# Patient Record
Sex: Female | Born: 1960 | Race: White | Hispanic: No | Marital: Married | Smoking: Never smoker
Health system: Southern US, Community
[De-identification: ages and names within clinical notes are randomized; demographics above are authoritative.]

## PROBLEM LIST (undated history)

## (undated) DIAGNOSIS — Z789 Other specified health status: Secondary | ICD-10-CM

## (undated) DIAGNOSIS — L719 Rosacea, unspecified: Secondary | ICD-10-CM

## (undated) HISTORY — DX: Rosacea, unspecified: L71.9

---

## 2009-07-14 ENCOUNTER — Encounter: Payer: Self-pay | Admitting: Gynecology

## 2009-07-14 ENCOUNTER — Ambulatory Visit: Payer: Self-pay | Admitting: Gynecology

## 2009-07-14 ENCOUNTER — Other Ambulatory Visit: Admission: RE | Admit: 2009-07-14 | Discharge: 2009-07-14 | Payer: Self-pay | Admitting: Gynecology

## 2009-10-03 ENCOUNTER — Ambulatory Visit: Payer: Self-pay | Admitting: Gynecology

## 2010-03-14 ENCOUNTER — Encounter: Admission: RE | Admit: 2010-03-14 | Discharge: 2010-03-14 | Payer: Self-pay | Admitting: Gynecology

## 2010-03-16 ENCOUNTER — Other Ambulatory Visit: Admission: RE | Admit: 2010-03-16 | Discharge: 2010-03-16 | Payer: Self-pay | Admitting: Gynecology

## 2010-03-16 ENCOUNTER — Ambulatory Visit: Payer: Self-pay | Admitting: Gynecology

## 2011-01-03 ENCOUNTER — Encounter: Payer: Self-pay | Admitting: Gynecology

## 2011-01-17 ENCOUNTER — Encounter (INDEPENDENT_AMBULATORY_CARE_PROVIDER_SITE_OTHER): Payer: 59 | Admitting: Gynecology

## 2011-01-17 ENCOUNTER — Other Ambulatory Visit (HOSPITAL_COMMUNITY)
Admission: RE | Admit: 2011-01-17 | Discharge: 2011-01-17 | Disposition: A | Discharge status: Home / Self Care | Payer: 59 | Source: Ambulatory Visit | Attending: Gynecology | Admitting: Gynecology

## 2011-01-17 ENCOUNTER — Other Ambulatory Visit: Payer: Self-pay | Admitting: Gynecology

## 2011-01-17 DIAGNOSIS — Z01419 Encounter for gynecological examination (general) (routine) without abnormal findings: Secondary | ICD-10-CM

## 2011-01-17 DIAGNOSIS — N926 Irregular menstruation, unspecified: Secondary | ICD-10-CM

## 2011-01-17 DIAGNOSIS — Z13220 Encounter for screening for lipoid disorders: Secondary | ICD-10-CM

## 2011-01-17 DIAGNOSIS — R823 Hemoglobinuria: Secondary | ICD-10-CM

## 2011-01-17 DIAGNOSIS — Z124 Encounter for screening for malignant neoplasm of cervix: Secondary | ICD-10-CM | POA: Insufficient documentation

## 2011-02-14 ENCOUNTER — Other Ambulatory Visit: Payer: 59

## 2011-02-14 ENCOUNTER — Ambulatory Visit (INDEPENDENT_AMBULATORY_CARE_PROVIDER_SITE_OTHER): Payer: 59 | Admitting: Gynecology

## 2011-02-14 DIAGNOSIS — D259 Leiomyoma of uterus, unspecified: Secondary | ICD-10-CM

## 2011-02-14 DIAGNOSIS — N930 Postcoital and contact bleeding: Secondary | ICD-10-CM

## 2011-02-14 DIAGNOSIS — N8300 Follicular cyst of ovary, unspecified side: Secondary | ICD-10-CM

## 2011-02-14 DIAGNOSIS — N831 Corpus luteum cyst of ovary, unspecified side: Secondary | ICD-10-CM

## 2011-03-08 ENCOUNTER — Other Ambulatory Visit: Payer: 59

## 2011-03-08 NOTE — H&P (Signed)
  [name], [name]               ACCOUNT NO.:  [id number]  MEDICAL RECORD NO.:  [id number]           PATIENT TYPE:  O  LOCATION:  InputValueNotLocate           FACILITY:  [facility]  PHYSICIAN:  Timothy P. Fontaine, M.D.DATE OF BIRTH:  [birth year]  DATE OF ADMISSION:  03/09/2011 DATE OF DISCHARGE:                             HISTORY & PHYSICAL   The patient being admitted to [facility] on Mar 09, 2011, at 7:30 a.m. for surgery.  CHIEF COMPLAINT:  Postcoital bleeding, irregular menses.  HISTORY OF PRESENT ILLNESS:  This 50 year old G28, P4 female presents with history of postcoital spotting and the irregular menses occurring every 2 to 3 weeks.  She had workup including a normal hormone studies and a sonohysterogram which showed a 21-mm posterior midline cavity defect consistent with an endometrial polyp.  The patient is admitted at this time for hysteroscopy D\and C, removal of endometrial polyp.  PAST MEDICAL HISTORY:  Uncomplicated.  PAST SURGICAL HISTORY:  Cesarean section.  CURRENT MEDICATIONS:  Multivitamin.  ALLERGIES:  PENICILLIN, SULFA, MACROBID  REVIEW OF SYSTEMS:  Noncontributory.  FAMILY HISTORY:  Noncontributory.  SOCIAL HISTORY:  Noncontributory.  ADMISSION PHYSICAL EXAMINATION:  VITAL SIGNS:  Afebrile.  Vital signs are stable. HEENT:  Normal. LUNGS:  Clear. CARDIAC:  Regular rate with no rubs, murmurs or gallops. ABDOMEN:  Abdominal exam benign. PELVIC:  External BUS, vagina normal.  Cervix normal.  Uterus retroverted, grossly normal in size, midline mobile, nontender.  Adnexa without masses or tenderness.  ASSESSMENT:  A 50 year old G58, P4 female, postcoital spotting, irregular menses, recent [facility] was 18 with sonohysterogram showing an endometrial polyp in the posterior cavity.  The patient is admitted for hysteroscopy D and C, removal of the endometrial polyp.  I discussed with the patient the proposed surgery, expected intraoperative and  postoperative courses, use of the hysteroscope and resectoscope in the the D and C portion.  I reviewed the risks of bleeding, transfusion, the risks of transfusion to include transfusion reaction, hepatitis, HIV, mad cow disease and other unknown entities, the risk of infection requiring prolonged antibiotics, uterine perforation causing damage to internal organs including bowel, bladder, ureters, vessels and nerves necessitating major exploratory reparative surgeries and future reparative surgeries including bowel resection, ureteral damage repair, bladder repair, ostomy formation were all discussed, understood and accepted.  The risk of distended media absorption leading to metabolic complications such as common seizures was also reviewed with her.  The patient's questions were answered to her satisfaction.  She is ready to proceed with surgery.     Timothy P. Audie Box, M.D.     TPF/MEDQ  D:  03/06/2011  T:  03/06/2011  Job:  [id number]  Electronically Signed by Colin Broach M.D. on 03/08/2011 05:35:08 PM

## 2011-03-09 ENCOUNTER — Ambulatory Visit (HOSPITAL_BASED_OUTPATIENT_CLINIC_OR_DEPARTMENT_OTHER)
Admission: RE | Admit: 2011-03-09 | Discharge: 2011-03-09 | Disposition: A | Discharge status: Home / Self Care | Payer: 59 | Source: Ambulatory Visit | Attending: Gynecology | Admitting: Gynecology

## 2011-03-09 ENCOUNTER — Other Ambulatory Visit: Payer: Self-pay | Admitting: Gynecology

## 2011-03-09 DIAGNOSIS — N840 Polyp of corpus uteri: Secondary | ICD-10-CM | POA: Insufficient documentation

## 2011-03-09 DIAGNOSIS — N926 Irregular menstruation, unspecified: Secondary | ICD-10-CM

## 2011-03-09 DIAGNOSIS — N930 Postcoital and contact bleeding: Secondary | ICD-10-CM | POA: Insufficient documentation

## 2011-03-09 DIAGNOSIS — N841 Polyp of cervix uteri: Secondary | ICD-10-CM | POA: Insufficient documentation

## 2011-03-09 LAB — POCT HEMOGLOBIN-HEMACUE: Hemoglobin: 13.5 g/dL (ref 12.0–15.0)

## 2011-03-09 LAB — POCT PREGNANCY, URINE: Preg Test, Ur: NEGATIVE

## 2011-03-22 ENCOUNTER — Ambulatory Visit (INDEPENDENT_AMBULATORY_CARE_PROVIDER_SITE_OTHER): Payer: 59 | Admitting: Gynecology

## 2011-03-22 DIAGNOSIS — Z9889 Other specified postprocedural states: Secondary | ICD-10-CM

## 2011-03-26 NOTE — Op Note (Signed)
  NAMESHAKYRA, MATTERA NO.:  [id number]  MEDICAL RECORD NO.:  [id number]           PATIENT TYPE:  LOCATION:                                 FACILITY:  PHYSICIAN:  Noach Calvillo P. Jailey Booton, M.D.DATE OF BIRTH:  [birth year]  DATE OF PROCEDURE:  03/09/2011 DATE OF DISCHARGE:                              OPERATIVE REPORT   PREOPERATIVE DIAGNOSES:  Postcoital bleeding, irregular menses, endometrial polyp.  POSTOPERATIVE DIAGNOSES:  Postcoital bleeding, irregular menses, endometrial polyp with additional endocervical polyp.  PROCEDURE:  Hysteroscopy, polypectomy, D and C.  SURGEON:  Keren Alverio P. Nathen Balaban, M.D.  ANESTHETIC:  General with 1% lidocaine paracervical block.  ESTIMATED BLOOD LOSS:  Minimal.  DISTENDED MEDIA DISCREPANCY:  50 mL.  COMPLICATIONS:  None.  SPECIMENS: 1. Polyps x2. 2. Endometrial curetting.  FINDINGS:  EUA, external BUS and vagina normal.  Cervix normal.  Uterus retroverted, normal size, midline, mobile.  Adnexa without masses. Hysteroscopic with finger-like upper endocervical polyp removed in its entirety, broad-based endometrial polyp, posterior uterine surface, removed in its entirety.  Hysteroscopy was otherwise adequate normal, noting fundus, anterior-posterior uterine surfaces, lower uterine segment, endocervical canal, right and left tubal ostia all visualized.  PROCEDURE:  The patient was taken to the operating room, underwent general anesthesia, placed in low dorsal lithotomy position, received a perineal and vaginal preparation with Betadine solution.  Bladder emptied with in-and-out Foley catheterization.  EUA performed and the patient was draped in usual fashion.  Cervix was visualized with a speculum.  Anterior lip grasped with a single-tooth tenaculum and a paracervical block using 1% lidocaine, total of 8 mL was placed.  Cervix was gently gradually dilated to admit the operative hysteroscope. Hysteroscopy was performed with  findings noted above.  The endocervical polyp was excised at its base using the right angle resectoscopic loop at the level of the surrounding endocervix and was sent to pathology intact.  The broad-based endometrial polyp was then also excised intact through several passes, transecting the broad base and again was sent to pathology.  A sharp curettage was then performed.  Re-hysteroscopy showed an empty cavity, good distention, no evidence of perforation. Both polyp specimens were sent as well as the endometrial curetting to pathology.  The instruments were removed.  Hemostasis visualized at the tenaculum site and the external os.  Speculum removed.  The patient placed in supine position, received intraoperative Toradol, was awakened without difficulty and taken to recovery room in good condition having tolerated the procedure well.     Merian Wroe P. Audie Box, M.D.     TPF/MEDQ  D:  03/09/2011  T:  03/09/2011  Job:  [id number]  Electronically Signed by Colin Broach M.D. on 03/26/2011 09:23:19 AM

## 2011-10-12 ENCOUNTER — Telehealth: Payer: Self-pay | Admitting: *Deleted

## 2011-10-12 NOTE — Telephone Encounter (Signed)
Recommend office visit to evaluate. If the bleeding becomes heavy of the weekend she can call me as I am on call and I prescribe something to slow it down or stop it. Hopefully it will stop on its own. She does still need to see me regardless so have her make an appointment for next week.

## 2011-10-12 NOTE — Telephone Encounter (Signed)
Pt called c/o bleeding, she had polyps removed in may. Pt said she had no bleeding all summer then in Nov 24 she begin to bleed for about 8-9 days and stopped and bleeding began again and has not stopped. Mild flow of bleeding that is bright red, pt is concerned if this is normal? Please advise

## 2011-10-12 NOTE — Telephone Encounter (Signed)
Pt informed with the below note, and will make appointment.

## 2011-10-18 ENCOUNTER — Encounter: Payer: Self-pay | Admitting: Gynecology

## 2011-10-18 ENCOUNTER — Encounter: Payer: Self-pay | Admitting: Anesthesiology

## 2011-10-18 ENCOUNTER — Ambulatory Visit (INDEPENDENT_AMBULATORY_CARE_PROVIDER_SITE_OTHER): Payer: 59 | Admitting: Gynecology

## 2011-10-18 VITALS — BP 110/68

## 2011-10-18 DIAGNOSIS — N926 Irregular menstruation, unspecified: Secondary | ICD-10-CM

## 2011-10-18 MED ORDER — MEDROXYPROGESTERONE ACETATE 10 MG PO TABS: 10.0000 mg | ORAL_TABLET | Freq: Every day | ORAL | Status: DC

## 2011-10-18 NOTE — Patient Instructions (Signed)
Take Provera as discussed. If without menses for 6 weeks call and we will plan intermittent progesterone withdrawals.. If sooner irregular bleeding call and we'll pursue a further evaluation.

## 2011-10-18 NOTE — Progress Notes (Signed)
Patient presents having had a hysteroscopy D&C for irregular menses and endometrial polyps in May 2012. She had an FSH at that time of 18. She had no menses following until November and then 2 weeks following having of her menses where she bled heavily and now she is tapering off. No pain, hot flushes, night sweats or other symptoms.   Exam with chaperone present Pelvic external BUS vagina normal, cervix normal, uterus retroverted normal size midline mobile nontender, adnexa without masses or tenderness  Assessment and plan: Irregular menses. Recent hysteroscopy D&C 6 months ago showed benign endometrial polyps and proliferative endometrium. Recommended Provera 10 mg x10 days withdrawal now. Keep menstrual calendar if without menses at 6 week intervals she will call me and we'll plan on intermittent progesterone withdrawal. If sooner irregular bleeding continues then she will call me and we'll proceed with a further evaluation to start with sonohysterogram. I would recheck her [facility] today and she'll follow her for these results.

## 2012-07-29 ENCOUNTER — Encounter: Payer: 59 | Admitting: Gynecology

## 2012-08-07 ENCOUNTER — Ambulatory Visit (INDEPENDENT_AMBULATORY_CARE_PROVIDER_SITE_OTHER): Payer: 59 | Admitting: Gynecology

## 2012-08-07 ENCOUNTER — Encounter: Payer: Self-pay | Admitting: Gynecology

## 2012-08-07 VITALS — BP 118/76 | Ht 63.5 in | Wt 142.0 lb

## 2012-08-07 DIAGNOSIS — Z01419 Encounter for gynecological examination (general) (routine) without abnormal findings: Secondary | ICD-10-CM

## 2012-08-07 DIAGNOSIS — N926 Irregular menstruation, unspecified: Secondary | ICD-10-CM

## 2012-08-07 DIAGNOSIS — Z13220 Encounter for screening for lipoid disorders: Secondary | ICD-10-CM

## 2012-08-07 LAB — CBC WITH DIFFERENTIAL/PLATELET
Basophils Absolute: 0.1 10*3/uL (ref 0.0–0.1)
Basophils Relative: 2 % — ABNORMAL HIGH (ref 0–1)
Eosinophils Absolute: 0.4 10*3/uL (ref 0.0–0.7)
Eosinophils Relative: 6 % — ABNORMAL HIGH (ref 0–5)
Hemoglobin: 13.2 g/dL (ref 12.0–15.0)
Lymphs Abs: 1.8 10*3/uL (ref 0.7–4.0)
MCHC: 33.8 g/dL (ref 30.0–36.0)
MCV: 85.7 fL (ref 78.0–100.0)
RDW: 14.5 % (ref 11.5–15.5)

## 2012-08-07 NOTE — Progress Notes (Signed)
[name] 12-May-1961 [id number]        52 y.o.  G4P4 for annual exam.  Several issues noted below.  Past medical history,surgical history, medications, allergies, family history and social history were all reviewed and documented in the EPIC chart. ROS:  Was performed and pertinent positives and negatives are included in the history.  Exam: Fleet Contras assistant Filed Vitals:   08/07/12 1619  BP: 118/76  Height: 5' 3.5" (1.613 m)  Weight: 142 lb (64.411 kg)   General appearance  Normal Skin grossly normal Head/Neck normal with no cervical or supraclavicular adenopathy thyroid normal Lungs  clear Cardiac RR, without RMG Abdominal  soft, nontender, without masses, organomegaly or hernia Breasts  examined lying and sitting without masses, retractions, discharge or axillary adenopathy. Pelvic  Ext/BUS/vagina  normal   Cervix  normal   Uterus  retroverted, normal size, shape and contour, midline and mobile nontender   Adnexa  Without masses or tenderness    Anus and perineum  normal   Rectovaginal  normal sphincter tone without palpated masses or tenderness.    Assessment/Plan:  51 y.o. G25P4 female for annual exam, vasectomy birth control.   1. Mild menstrual regularity. Patient underwent hysteroscopy D&C for endometrial polyp May 2012. Menses have been basically monthly although she has skipped an occasional month with some mild irregularity. Will check FSH. At this point recommended keeping menstrual calendar. Reviewed signs and symptoms of menopause and she'll alert me if she has significant hot flashes/night sweats or significant menstrual irregularity. As long as less frequent but regular menses them will monitor. 2. Mammography. Patients do now knows to schedule. SBE monthly reviewed. 3. Pap smear. No Pap smear done today. Last Pap smear 2012. No history of significant abnormalities. We'll plan every 3-5 year screening. 4. Colonoscopy. Recommended screening colonoscopy. Names given  for her to call. 5. Health maintenance. Baseline CBC comprehensive metabolic panel lipid profile urinalysis along with her [facility] ordered. Follow up one year, sooner as needed.    Dara Lords MD, 5:12 PM 08/07/2012

## 2012-08-07 NOTE — Patient Instructions (Addendum)
Office will contact you with lab results. Keep menstrual calendar. As long as not significant irregularity them we'll monitor. If significant irregular bleeding or significant menopausal symptoms recommend follow up. Follow up in one year for annual gynecologic exam.

## 2012-08-08 ENCOUNTER — Encounter: Payer: Self-pay | Admitting: Gynecology

## 2012-08-08 LAB — URINALYSIS W MICROSCOPIC + REFLEX CULTURE
Bacteria, UA: NONE SEEN
Casts: NONE SEEN
Hgb urine dipstick: NEGATIVE
Ketones, ur: NEGATIVE mg/dL
Protein, ur: NEGATIVE mg/dL
Specific Gravity, Urine: 1.024 (ref 1.005–1.030)
Squamous Epithelial / LPF: NONE SEEN

## 2012-08-08 LAB — COMPREHENSIVE METABOLIC PANEL
AST: 23 U/L (ref 0–37)
BUN: 13 mg/dL (ref 6–23)
CO2: 28 mEq/L (ref 19–32)
Creat: 0.84 mg/dL (ref 0.50–1.10)
Glucose, Bld: 83 mg/dL (ref 70–99)
Potassium: 4.8 mEq/L (ref 3.5–5.3)
Total Protein: 6.6 g/dL (ref 6.0–8.3)

## 2012-08-08 LAB — LIPID PANEL
Cholesterol: 169 mg/dL (ref 0–200)
HDL: 55 mg/dL (ref >39)
LDL Cholesterol: 102 mg/dL — ABNORMAL HIGH (ref 0–99)
Triglycerides: 61 mg/dL (ref <150)
VLDL: 12 mg/dL (ref 0–40)

## 2012-08-08 LAB — FOLLICLE STIMULATING HORMONE: FSH: 61.3 m[IU]/mL

## 2012-09-09 ENCOUNTER — Encounter: Payer: Self-pay | Admitting: *Deleted

## 2012-09-09 NOTE — Progress Notes (Signed)
Patient ID: [name], female   DOB: September 30, 1961, 51 y.o.   MRN: [id number] Pt left message for recent lab result, left message for pt to call.

## 2012-09-10 ENCOUNTER — Encounter: Payer: Self-pay | Admitting: *Deleted

## 2012-09-10 NOTE — Progress Notes (Signed)
Patient ID: [name], female   DOB: November 11, 1960, 51 y.o.   MRN: [id number] Pt called requesting recent lab results, left message for pt to call.

## 2012-09-12 ENCOUNTER — Telehealth: Payer: Self-pay | Admitting: *Deleted

## 2012-09-12 NOTE — Telephone Encounter (Signed)
Pt called requesting lab results 08/07/12 OV left results on voicemail. Expect FSH results, pt was already informed.

## 2013-12-21 ENCOUNTER — Other Ambulatory Visit (HOSPITAL_COMMUNITY)
Admission: RE | Admit: 2013-12-21 | Discharge: 2013-12-21 | Disposition: A | Discharge status: Home / Self Care | Payer: BC Managed Care – PPO | Source: Ambulatory Visit | Attending: Gynecology | Admitting: Gynecology

## 2013-12-21 ENCOUNTER — Encounter: Payer: Self-pay | Admitting: Gynecology

## 2013-12-21 ENCOUNTER — Ambulatory Visit (INDEPENDENT_AMBULATORY_CARE_PROVIDER_SITE_OTHER): Payer: BC Managed Care – PPO | Admitting: Gynecology

## 2013-12-21 VITALS — BP 120/72 | Ht 63.0 in | Wt 143.0 lb

## 2013-12-21 DIAGNOSIS — Z1151 Encounter for screening for human papillomavirus (HPV): Secondary | ICD-10-CM | POA: Insufficient documentation

## 2013-12-21 DIAGNOSIS — N9489 Other specified conditions associated with female genital organs and menstrual cycle: Secondary | ICD-10-CM

## 2013-12-21 DIAGNOSIS — N951 Menopausal and female climacteric states: Secondary | ICD-10-CM

## 2013-12-21 DIAGNOSIS — N898 Other specified noninflammatory disorders of vagina: Secondary | ICD-10-CM

## 2013-12-21 DIAGNOSIS — Z01419 Encounter for gynecological examination (general) (routine) without abnormal findings: Secondary | ICD-10-CM

## 2013-12-21 LAB — CBC WITH DIFFERENTIAL/PLATELET
Basophils Absolute: 0.1 10*3/uL (ref 0.0–0.1)
Basophils Relative: 2 % — ABNORMAL HIGH (ref 0–1)
Eosinophils Absolute: 0.3 10*3/uL (ref 0.0–0.7)
Eosinophils Relative: 4 % (ref 0–5)
HEMATOCRIT: 39.9 % (ref 36.0–46.0)
HEMOGLOBIN: 13.4 g/dL (ref 12.0–15.0)
Lymphocytes Relative: 28 % (ref 12–46)
Lymphs Abs: 1.8 10*3/uL (ref 0.7–4.0)
MCH: 28.8 pg (ref 26.0–34.0)
MCHC: 33.6 g/dL (ref 30.0–36.0)
MCV: 85.6 fL (ref 78.0–100.0)
Monocytes Absolute: 0.5 10*3/uL (ref 0.1–1.0)
Monocytes Relative: 8 % (ref 3–12)
NEUTROS PCT: 58 % (ref 43–77)
Neutro Abs: 3.7 10*3/uL (ref 1.7–7.7)
PLATELETS: 439 10*3/uL — AB (ref 150–400)
RBC: 4.66 MIL/uL (ref 3.87–5.11)
RDW: 14.1 % (ref 11.5–15.5)
WBC: 6.4 10*3/uL (ref 4.0–10.5)

## 2013-12-21 LAB — LIPID PANEL
CHOL/HDL RATIO: 3.2 ratio
Cholesterol: 172 mg/dL (ref 0–200)
HDL: 54 mg/dL (ref >39)
LDL CALC: 104 mg/dL — AB (ref 0–99)
TRIGLYCERIDES: 68 mg/dL (ref <150)
VLDL: 14 mg/dL (ref 0–40)

## 2013-12-21 LAB — COMPREHENSIVE METABOLIC PANEL
ALT: 14 U/L (ref 0–35)
AST: 20 U/L (ref 0–37)
Albumin: 4.4 g/dL (ref 3.5–5.2)
Alkaline Phosphatase: 56 U/L (ref 39–117)
BUN: 12 mg/dL (ref 6–23)
CALCIUM: 9.4 mg/dL (ref 8.4–10.5)
CHLORIDE: 105 meq/L (ref 96–112)
CO2: 30 meq/L (ref 19–32)
CREATININE: 0.8 mg/dL (ref 0.50–1.10)
Glucose, Bld: 107 mg/dL — ABNORMAL HIGH (ref 70–99)
POTASSIUM: 3.9 meq/L (ref 3.5–5.3)
Sodium: 143 mEq/L (ref 135–145)
TOTAL PROTEIN: 6.7 g/dL (ref 6.0–8.3)
Total Bilirubin: 0.5 mg/dL (ref 0.2–1.2)

## 2013-12-21 LAB — TSH: TSH: 1.955 u[IU]/mL (ref 0.350–4.500)

## 2013-12-21 NOTE — Patient Instructions (Signed)
Schedule colonoscopy with [facility] at [phone] or [facility] gastroenterology at [phone] Try over-the-counter soy-based products for the menopausal symptoms. Followup if your symptoms worsen and you want to discuss hormone replacement therapy. Call me if you do any vaginal bleeding. Followup in one year for annual exam.

## 2013-12-21 NOTE — Progress Notes (Signed)
[name] 08-07-1961 [id number]        52 y.o.  G4P4 for annual exam.  Several issues that below.  Past medical history,surgical history, problem list, medications, allergies, family history and social history were all reviewed and documented in the EPIC chart.  ROS:  Performed and pertinent positives and negatives are included in the history, assessment and plan .  Exam: [name] assistant Filed Vitals:   12/21/13 1458  BP: 120/72  Height: 5\' 3"  (1.6 m)  Weight: 143 lb (64.864 kg)   General appearance  Normal Skin grossly normal Head/Neck normal with no cervical or supraclavicular adenopathy thyroid normal Lungs  clear Cardiac RR, without RMG Abdominal  soft, nontender, without masses, organomegaly or hernia Breasts  examined lying and sitting without masses, retractions, discharge or axillary adenopathy. Pelvic  Ext/BUS/vagina normal  Cervix normal. Pap/HPV  Uterus anteverted, normal size, shape and contour, midline and mobile nontender   Adnexa  Without masses or tenderness    Anus and perineum  Normal   Rectovaginal  Normal sphincter tone without palpated masses or tenderness.    Assessment/Plan:  53 y.o. G107P4 female for annual exam, amenorrhea, vasectomy birth control.   1. Postmenopausal. Patient has stopped having menses over the past year. Last bleeding episode over 6 months ago as a light menses. Is having some hot flushes and night sweats as well as foggy thinking. Also having some vaginal dryness with intercourse. Reviewed options with her to include OTC products such as lubricants, soy-based products, prescription nonhormonal such as Effexor and full HRT.  I reviewed the whole issue of HRT with her to include the WHI study with increased risk of stroke, heart attack, DVT and breast cancer. The ACOG and NAMS statements for lowest dose for the shortest period of time reviewed. Transdermal versus oral first-pass effect benefit discussed. Patient not interested in  prescriptions at this time but prefers OTC product trial. Will followup with me if symptoms worsen and she wants to rediscuss medications. Patient does report any vaginal bleeding. 2. Pap smear 2012. Pap/HPV today. No history of significant abnormal Pap smears previously. Plan repeat Pap smear at 3-5 year interval assuming normal. 3. Mammography 08/2013. Continue with annual mammography. SBE monthly reviewed. 4. Colonoscopy never. I again strongly recommended that she schedule a baseline colonoscopy. Name is provided. Benefits of early detection reviewed. 5. DEXA never. We'll plan further into the menopause. Increased calcium vitamin D recommendations reviewed. 6. Health maintenance. Baseline CBC comprehensive metabolic panel lipid profile urinalysis vitamin D and TSH ordered. Followup in one year, sooner as noted above.   Note: This document was prepared with digital dictation and possible smart phrase technology. Any transcriptional errors that result from this process are unintentional.   Anastasio Auerbach MD, 3:41 PM 12/21/2013

## 2013-12-22 LAB — VITAMIN D 25 HYDROXY (VIT D DEFICIENCY, FRACTURES): VIT D 25 HYDROXY: 39 ng/mL (ref 30–89)

## 2013-12-22 LAB — URINALYSIS W MICROSCOPIC + REFLEX CULTURE
BILIRUBIN URINE: NEGATIVE
Bacteria, UA: NONE SEEN
Casts: NONE SEEN
Crystals: NONE SEEN
GLUCOSE, UA: NEGATIVE mg/dL
KETONES UR: NEGATIVE mg/dL
Leukocytes, UA: NEGATIVE
Nitrite: NEGATIVE
PROTEIN: NEGATIVE mg/dL
SQUAMOUS EPITHELIAL / LPF: NONE SEEN
Specific Gravity, Urine: <1.005 — ABNORMAL LOW (ref 1.005–1.030)
UROBILINOGEN UA: 0.2 mg/dL (ref 0.0–1.0)
pH: 5 (ref 5.0–8.0)

## 2013-12-25 ENCOUNTER — Encounter: Payer: Self-pay | Admitting: Gynecology

## 2014-03-10 ENCOUNTER — Telehealth: Payer: Self-pay | Admitting: *Deleted

## 2014-03-10 NOTE — Telephone Encounter (Signed)
(  pt aware you are out of the office) Pt was told from [place] 12/21/13 to call if any bleeding, pt said she has been spotting since Sunday. Very light wearing panty liner only. Please advise

## 2014-03-11 NOTE — Telephone Encounter (Signed)
Recommend scheduling sonohystogram

## 2014-03-12 ENCOUNTER — Other Ambulatory Visit: Payer: Self-pay | Admitting: Gynecology

## 2014-03-12 DIAGNOSIS — N950 Postmenopausal bleeding: Secondary | ICD-10-CM

## 2014-03-12 NOTE — Telephone Encounter (Signed)
Patient informed. Transferred to the appt desk to schedule.

## 2014-03-12 NOTE — Telephone Encounter (Signed)
Left message for patient to call.

## 2014-04-05 ENCOUNTER — Other Ambulatory Visit: Payer: Self-pay | Admitting: Gynecology

## 2014-04-05 DIAGNOSIS — N950 Postmenopausal bleeding: Secondary | ICD-10-CM

## 2014-04-14 ENCOUNTER — Ambulatory Visit (INDEPENDENT_AMBULATORY_CARE_PROVIDER_SITE_OTHER): Payer: BC Managed Care – PPO | Admitting: Gynecology

## 2014-04-14 ENCOUNTER — Encounter: Payer: Self-pay | Admitting: Gynecology

## 2014-04-14 ENCOUNTER — Other Ambulatory Visit: Payer: Self-pay | Admitting: Gynecology

## 2014-04-14 ENCOUNTER — Ambulatory Visit (INDEPENDENT_AMBULATORY_CARE_PROVIDER_SITE_OTHER): Payer: BC Managed Care – PPO

## 2014-04-14 DIAGNOSIS — N839 Noninflammatory disorder of ovary, fallopian tube and broad ligament, unspecified: Secondary | ICD-10-CM

## 2014-04-14 DIAGNOSIS — N83209 Unspecified ovarian cyst, unspecified side: Secondary | ICD-10-CM

## 2014-04-14 DIAGNOSIS — N840 Polyp of corpus uteri: Secondary | ICD-10-CM

## 2014-04-14 DIAGNOSIS — N950 Postmenopausal bleeding: Secondary | ICD-10-CM

## 2014-04-14 DIAGNOSIS — N854 Malposition of uterus: Secondary | ICD-10-CM

## 2014-04-14 DIAGNOSIS — D251 Intramural leiomyoma of uterus: Secondary | ICD-10-CM

## 2014-04-14 DIAGNOSIS — D259 Leiomyoma of uterus, unspecified: Secondary | ICD-10-CM

## 2014-04-14 NOTE — Progress Notes (Addendum)
[name] 1961/03/02 [id number]        52 y.o.  G4P4 presents having called with a history of spotting last month lasting almost a week requiring a pain on her period and then one year without menses. Has had some episodes previously of sporadic spotting. Does have a history of endometrial polyps in the past status post hysteroscopic resection 2012.  Past medical history,surgical history, problem list, medications, allergies, family history and social history were all reviewed and documented in the EPIC chart.  Directed ROS with pertinent positives and negatives documented in the history of present illness/assessment and plan.  Exam: Pam assistant General appearance  Normal Pelvic external BUS vagina with atrophic changes. Cervix normal with atrophic changes. Uterus retroverted grossly normal in size midline mobile nontender. Adnexa without masses or tenderness  Ultrasound shows uterus normal size. Several small leiomyomata noted. Endometrial echo 3.1 mm. Right ovary normal with several small follicles. Left ovary with solid mass area measuring 13 x 15 x 16 mm. Cul-de-sac negative.   Sonohysterogram performed, sterile technique, easy catheter introduction, good distention with a small endometrial polyp 8 x 9 x 6 mm. Endometrial sample taken. Patient tolerated well.  Assessment/Plan:  [name] with perimenopausal bleeding. Ultrasound shows several small myomas stable from prior ultrasound. Endometrial echo thin but clear endometrial polyp visualized on sonohysterogram. Also with solid appearing area on left ovary not visualized with prior ultrasound 2012 questionable small fibroma versus physiologic change. Patient will follow up for biopsy result. I reviewed recommendation proceed with hysteroscopic resection of endometrial polyp and she agrees although does not want to do during the summer. I think reasonable to wait till the end of the summer early fall assuming initial biopsy negative and  she accepts I cannot guarantee pathology until then. Recommend followup ultrasound to relook at the left ovary. Plan to repeat the ultrasound in 2-3 months and then schedule hysteroscopy resection endometrial polyp following this the superior this area remains stable and or resolves. If this area enlarges then we'll discuss a more aggressive approach to include laparoscopy removal of the ovary and hysteroscopy D&C at the same time 2 including possible hysterectomy BSO. Again she understands I cannot guarantee pathology of the left ovary is comfortable with repeat ultrasound in 2 to 3 months.   Note: This document was prepared with digital dictation and possible smart phrase technology. Any transcriptional errors that result from this process are unintentional.   Anastasio Auerbach MD, 5:13 PM 04/14/2014

## 2014-04-14 NOTE — Patient Instructions (Addendum)
Office will call you with biopsy results. Followup for ultrasound as scheduled in 2-3 months. Report if bleeding becomes unacceptable in the interim.

## 2014-04-16 ENCOUNTER — Telehealth: Payer: Self-pay | Admitting: Gynecology

## 2014-04-16 ENCOUNTER — Telehealth: Payer: Self-pay

## 2014-04-16 NOTE — Telephone Encounter (Deleted)
Did you still want to plan a recheck of her Vitamin D level in 12 weeks? (I didn't know if this lower dose may take longer to see affect.)

## 2014-04-16 NOTE — Telephone Encounter (Signed)
Patient would really like to talk with you.  She said you had told her she had a "dense spot on part of her ovary".  She said she has been thinking about that and has concern that maybe she should do something sooner than 2-3 mos in regards to that.

## 2014-04-16 NOTE — Telephone Encounter (Signed)
We called the patient with the biopsy results from her sonohysterogram. She had more questions and I called her back. She was somewhat confused about the left ovary solid appearing mass measuring 13 x 15 x 16 mm. I again reviewed the situation that she has a small solid-appearing area in the left ovary. This may be a physiologic change but also may be a small tumor such as a fibroma. Options for management are to remove it now laparoscopically versus observation with repeat ultrasound in 2 months. Disclaimers far as cannot rule out cancer was reviewed. She is a small polyp in her uterus and we were planning on hysteroscopy but she wanted to postpone this to the end of summer. I think it is reasonable given the negative endometrial biopsy and her past history of benign endometrial polyps. The plan was to repeat the ultrasound on the ovary in 2 months and if this area is resolved and proceed with hysteroscopy. If there is any question about the ovary then to combine the procedure and remove the ovary along with hysteroscopy. Patient now clearly understands the situation. I encouraged her if she has any further questions to call back or she changes her mind and is uncomfortable with observation that we can proceed with laparoscopy now.

## 2014-04-16 NOTE — Telephone Encounter (Signed)
Message copied by Ramond Craver on Fri Apr 16, 2014  2:22 PM ------      Message from: Anastasio Auerbach      Created: Fri Apr 16, 2014  2:11 PM       Tell patient that the biopsy showed benign endometrial tissue. The plan we discussed was to repeat the ultrasound in 2-3 months and then plan hysteroscopy resection after that. If she has any questions I will be glad to talk to her. ------

## 2014-06-16 ENCOUNTER — Ambulatory Visit (INDEPENDENT_AMBULATORY_CARE_PROVIDER_SITE_OTHER): Payer: BC Managed Care – PPO

## 2014-06-16 ENCOUNTER — Encounter: Payer: Self-pay | Admitting: Gynecology

## 2014-06-16 ENCOUNTER — Ambulatory Visit (INDEPENDENT_AMBULATORY_CARE_PROVIDER_SITE_OTHER): Payer: BC Managed Care – PPO | Admitting: Gynecology

## 2014-06-16 DIAGNOSIS — N83209 Unspecified ovarian cyst, unspecified side: Secondary | ICD-10-CM

## 2014-06-16 DIAGNOSIS — N950 Postmenopausal bleeding: Secondary | ICD-10-CM

## 2014-06-16 DIAGNOSIS — N840 Polyp of corpus uteri: Secondary | ICD-10-CM

## 2014-06-16 NOTE — Patient Instructions (Signed)
Office will call you to arrange surgery. 

## 2014-06-16 NOTE — Progress Notes (Signed)
[name] June 17, 1961 [id number]        53 y.o.  G4P4 presents for followup ultrasound.  History of spotting for approximately a week in June after one year of no menses. Does have a history of endometrial polyps in the past with resection in 2012. Sonohysterogram was performed which showed a clear small 8 x 9 x 6 mm polyp but also showed a solid area on her left ovary questionable mass versus physiologic. Endometrial biopsy was benign. Patient was asked to followup for repeat ultrasound to make sure this area remains stable or resolved before scheduling her hysteroscopy in the event that additional surgery such as laparoscopy was required.  Past medical history,surgical history, problem list, medications, allergies, family history and social history were all reviewed and documented in the EPIC chart.  Directed ROS with pertinent positives and negatives documented in the history of present illness/assessment and plan.  Ultrasound shows uterus retroverted unchanged from her prior ultrasound. Right and left ovaries visualized and normal with no evidence of the prior solid area. Cul-de-sac is negative.  Assessment/Plan:  [name] with history of post menopausal bleeding and sonohysterogram showing endometrial polyp. Plan is to proceed with hysteroscopy D&C resection of the polyp.  Patient will schedule and then will represent for a preoperative consult before hand. She also asked for a physical therapy referral due to muscle spasm and a "catch" in her left trapezius muscle. Exam is normal at evidence of muscle spasm or mass. Will refer to physical therapy to see if they can help her with this.   Note: This document was prepared with digital dictation and possible smart phrase technology. Any transcriptional errors that result from this process are unintentional.   Anastasio Auerbach MD, 4:34 PM 06/16/2014

## 2014-06-17 ENCOUNTER — Telehealth: Payer: Self-pay | Admitting: *Deleted

## 2014-06-17 ENCOUNTER — Other Ambulatory Visit: Payer: Self-pay | Admitting: Gynecology

## 2014-06-17 MED ORDER — MISOPROSTOL 200 MCG PO TABS: ORAL_TABLET | ORAL | Status: DC

## 2014-06-17 NOTE — Telephone Encounter (Signed)
Message copied by Thamas Jaegers on Thu Jun 17, 2014 10:00 AM ------      Message from: [name]      Created: Wed Jun 16, 2014  4:44 PM       Patient requests a referral to physical therapy reference muscle spasm with catch left trapezius. ------

## 2014-06-17 NOTE — Telephone Encounter (Signed)
Appointment on 07/06/14 @ 2:30 pm at [facility]. Specialist, notes faxed to [phone]. Pt informed with all the above.

## 2014-07-01 ENCOUNTER — Telehealth: Payer: Self-pay | Admitting: *Deleted

## 2014-07-01 NOTE — Telephone Encounter (Signed)
Pt had her PT appointment with  Hilaria Ota. Specialist, scheduled for a third one today and is not happy with the progression. Was seen for reference muscle spasm with catch left trapezius. Please advise

## 2014-07-01 NOTE — Telephone Encounter (Signed)
Needs to see orthopedic surgeon for further evaluation and treatment

## 2014-07-06 NOTE — Telephone Encounter (Signed)
Pt informed with the below note, pt said she spoke to the PT and has a new therapist she is seeing at the time. And doing well

## 2014-07-15 ENCOUNTER — Encounter (HOSPITAL_COMMUNITY): Payer: Self-pay | Admitting: Pharmacist

## 2014-07-19 ENCOUNTER — Encounter (HOSPITAL_COMMUNITY): Payer: Self-pay | Admitting: *Deleted

## 2014-07-21 ENCOUNTER — Ambulatory Visit (INDEPENDENT_AMBULATORY_CARE_PROVIDER_SITE_OTHER): Payer: BC Managed Care – PPO | Admitting: Gynecology

## 2014-07-21 ENCOUNTER — Encounter: Payer: Self-pay | Admitting: Gynecology

## 2014-07-21 DIAGNOSIS — N950 Postmenopausal bleeding: Secondary | ICD-10-CM

## 2014-07-21 DIAGNOSIS — N840 Polyp of corpus uteri: Secondary | ICD-10-CM

## 2014-07-21 NOTE — H&P (Signed)
[name] Jul 23, 1961 [id number]   History and Physical  Chief complaint: postmenopausal bleeding, endometrial polyp  History of present illness: [name] with history of spotting in May. Follow up sonohysterogram showed several small leiomyoma with endometrial echo 3.1 mm. Small 8 x 9 x 6 mm endometrial polyp noted.  Endometrial biopsy showed: Endometrium, biopsy, uterus - MINUTE FRAGMENTS OF DISAGGREGATED ENDOMETRIAL GLANDS AND STROMA; NEGATIVE FOR HYPERPLASIA OR MALIGNANCY. - MINUTE FRAGMENTS OF BENIGN ENDOCERVICAL MUCOSA. - MINUTE FRAGMENTS OF SQUAMOUS EPITHELIUM; NEGATIVE FOR INTRAEPITHELIAL LESION OR MALIGNANCY. She did have an solid appearing area on her left ovary and before proceeding with hysteroscopy D&C we repeated the ultrasound at a 2 month interval in August and this area resolved. Patient is admitted at this time for hysteroscopy D&C resection of the endometrial polyp.  Past medical history,surgical history, medications, allergies, family history and social history were all reviewed and documented in the EPIC chart.  ROS:  Was performed and pertinent positives and negatives are included in the history of present illness.  Exam:  [name] assistant General: well developed, well nourished female, no acute distress HEENT: normal  Lungs: clear to auscultation without wheezing, rales or rhonchi  Cardiac: regular rate without rubs, murmurs or gallops  Abdomen: soft, nontender without masses, guarding, rebound, organomegaly  Pelvic: external bus vagina: normal   Cervix: grossly normal  Uterus: Retroverted. normal size, midline and mobile, nontender  Adnexa: without masses or tenderness     Assessment/Plan:  [name] with postmenopausal bleeding and endometrial polyp on sonohysterogram for hysteroscopy D&C. She does have a history of prior endometrial polyps status post cystoscopic resection 2012.  I reviewed the proposed surgery with the patient to include the expected  intraoperative and postoperative courses as well as the recovery period. The use of the hysteroscope, resectoscope and the D&C portion were all discussed. The risks of surgery to include infection, prolonged antibiotics, hemorrhage necessitating transfusion and the risks of transfusion including transfusion reaction, hepatitis, HIV, mad cow disease and other unknown entities were all discussed understood and accepted. The risk of damage to internal organs during the procedure, either immediately recognized or delay recognized, including vagina, cervix, uterus, possible perforation causing damage to bowel, bladder, ureters, vessels and nerves necessitating major exploratory reparative surgery and future repair of surgeries including bladder repair, ureteral damage repair, bowel resection, ostomy formation was also discussed understood and accepted. The potential for distended media absorption leading to metabolic complications such as fluid overload, coma and seizures was also discussed understood and accepted. This is the second hysteroscopic resection in 3 years. No guarantees that this will resolve her issues and that she will never have to do this again discussed. I reviewed the instructions as far as how to place the cytotec tablet intravaginal the night before.The patient's questions were answered to her satisfaction and she is ready to proceed with surgery.    Anastasio Auerbach MD, 3:25 PM 07/21/2014

## 2014-07-21 NOTE — Patient Instructions (Signed)
Followup for surgery as scheduled. 

## 2014-07-21 NOTE — Progress Notes (Signed)
[name] 1960/12/16 [id number]   Preoperative consult  Chief complaint: postmenopausal bleeding, endometrial polyp  History of present illness: [name] with history of spotting in May. Follow up sonohysterogram showed several small leiomyoma with endometrial echo 3.1 mm. Small 8 x 9 x 6 mm endometrial polyp noted.  Endometrial biopsy showed: Endometrium, biopsy, uterus - MINUTE FRAGMENTS OF DISAGGREGATED ENDOMETRIAL GLANDS AND STROMA; NEGATIVE FOR HYPERPLASIA OR MALIGNANCY. - MINUTE FRAGMENTS OF BENIGN ENDOCERVICAL MUCOSA. - MINUTE FRAGMENTS OF SQUAMOUS EPITHELIUM; NEGATIVE FOR INTRAEPITHELIAL LESION OR MALIGNANCY. She did have an solid appearing area on her left ovary and before proceeding with hysteroscopy D&C we repeated the ultrasound at a 2 month interval in August and this area resolved. Patient is admitted at this time for hysteroscopy D&C resection of the endometrial polyp.  Past medical history,surgical history, medications, allergies, family history and social history were all reviewed and documented in the EPIC chart.  ROS:  Was performed and pertinent positives and negatives are included in the history of present illness.  Exam:  [name] assistant General: well developed, well nourished female, no acute distress HEENT: normal  Lungs: clear to auscultation without wheezing, rales or rhonchi  Cardiac: regular rate without rubs, murmurs or gallops  Abdomen: soft, nontender without masses, guarding, rebound, organomegaly  Pelvic: external bus vagina: normal   Cervix: grossly normal  Uterus: Retroverted. normal size, midline and mobile, nontender  Adnexa: without masses or tenderness     Assessment/Plan:  [name] with postmenopausal bleeding and endometrial polyp on sonohysterogram for hysteroscopy D&C. She does have a history of prior endometrial polyps status post cystoscopic resection 2012.  I reviewed the proposed surgery with the patient to include the expected  intraoperative and postoperative courses as well as the recovery period. The use of the hysteroscope, resectoscope and the D&C portion were all discussed. The risks of surgery to include infection, prolonged antibiotics, hemorrhage necessitating transfusion and the risks of transfusion including transfusion reaction, hepatitis, HIV, mad cow disease and other unknown entities were all discussed understood and accepted. The risk of damage to internal organs during the procedure, either immediately recognized or delay recognized, including vagina, cervix, uterus, possible perforation causing damage to bowel, bladder, ureters, vessels and nerves necessitating major exploratory reparative surgery and future repair of surgeries including bladder repair, ureteral damage repair, bowel resection, ostomy formation was also discussed understood and accepted. The potential for distended media absorption leading to metabolic complications such as fluid overload, coma and seizures was also discussed understood and accepted. This is the second hysteroscopic resection in 3 years. No guarantees that this will resolve her issues and that she will never have to do this again discussed. The patient's questions were answered to her satisfaction and she is ready to proceed with surgery.     Anastasio Auerbach MD, 3:18 PM 07/21/2014

## 2014-07-27 MED ORDER — CLINDAMYCIN PHOSPHATE 900 MG/50ML IV SOLN
900.0000 mg | INTRAVENOUS | Status: AC
  Administered 2014-07-28: 900 mg via INTRAVENOUS
  Filled 2014-07-27: qty 50

## 2014-07-27 MED ORDER — CIPROFLOXACIN IN D5W 400 MG/200ML IV SOLN
400.0000 mg | INTRAVENOUS | Status: AC
  Administered 2014-07-28: 400 mg via INTRAVENOUS
  Filled 2014-07-27: qty 200

## 2014-07-28 ENCOUNTER — Encounter (HOSPITAL_COMMUNITY): Payer: BC Managed Care – PPO | Admitting: Anesthesiology

## 2014-07-28 ENCOUNTER — Ambulatory Visit (HOSPITAL_COMMUNITY)
Admission: RE | Admit: 2014-07-28 | Discharge: 2014-07-28 | Disposition: A | Discharge status: Home / Self Care | Payer: BC Managed Care – PPO | Source: Ambulatory Visit | Attending: Gynecology | Admitting: Gynecology

## 2014-07-28 ENCOUNTER — Ambulatory Visit (HOSPITAL_COMMUNITY): Payer: BC Managed Care – PPO | Admitting: Anesthesiology

## 2014-07-28 ENCOUNTER — Encounter (HOSPITAL_COMMUNITY)
Admission: RE | Disposition: A | Discharge status: Home / Self Care | Payer: Self-pay | Source: Ambulatory Visit | Attending: Gynecology

## 2014-07-28 ENCOUNTER — Encounter (HOSPITAL_COMMUNITY): Payer: Self-pay | Admitting: Certified Registered Nurse Anesthetist

## 2014-07-28 DIAGNOSIS — N950 Postmenopausal bleeding: Secondary | ICD-10-CM

## 2014-07-28 DIAGNOSIS — N924 Excessive bleeding in the premenopausal period: Secondary | ICD-10-CM | POA: Insufficient documentation

## 2014-07-28 DIAGNOSIS — N840 Polyp of corpus uteri: Secondary | ICD-10-CM | POA: Insufficient documentation

## 2014-07-28 HISTORY — PX: DILATATION & CURETTAGE/HYSTEROSCOPY WITH TRUECLEAR: SHX6353

## 2014-07-28 HISTORY — DX: Other specified health status: Z78.9

## 2014-07-28 LAB — CBC
HEMATOCRIT: 42 % (ref 36.0–46.0)
Hemoglobin: 13.9 g/dL (ref 12.0–15.0)
MCH: 29.1 pg (ref 26.0–34.0)
MCHC: 33.1 g/dL (ref 30.0–36.0)
MCV: 87.9 fL (ref 78.0–100.0)
Platelets: 390 10*3/uL (ref 150–400)
RBC: 4.78 MIL/uL (ref 3.87–5.11)
RDW: 13 % (ref 11.5–15.5)
WBC: 6.7 10*3/uL (ref 4.0–10.5)

## 2014-07-28 SURGERY — DILATATION & CURETTAGE/HYSTEROSCOPY WITH TRUCLEAR
Anesthesia: General | Site: Vagina

## 2014-07-28 MED ORDER — SCOPOLAMINE 1 MG/3DAYS TD PT72
1.0000 | MEDICATED_PATCH | Freq: Once | TRANSDERMAL | Status: DC
Start: 2014-07-28 — End: 2014-07-28
  Administered 2014-07-28: 1.5 mg via TRANSDERMAL

## 2014-07-28 MED ORDER — KETOROLAC TROMETHAMINE 30 MG/ML IJ SOLN
INTRAMUSCULAR | Status: DC | PRN
  Administered 2014-07-28: 30 mg via INTRAVENOUS

## 2014-07-28 MED ORDER — SODIUM CHLORIDE 0.9 % IR SOLN
Status: DC | PRN
  Administered 2014-07-28: 3000 mL

## 2014-07-28 MED ORDER — SCOPOLAMINE 1 MG/3DAYS TD PT72
MEDICATED_PATCH | TRANSDERMAL | Status: AC
  Administered 2014-07-28: 1.5 mg via TRANSDERMAL
  Filled 2014-07-28: qty 1

## 2014-07-28 MED ORDER — KETOROLAC TROMETHAMINE 30 MG/ML IJ SOLN
INTRAMUSCULAR | Status: AC
  Filled 2014-07-28: qty 1

## 2014-07-28 MED ORDER — MIDAZOLAM HCL 2 MG/2ML IJ SOLN: 0.5000 mg | Freq: Once | INTRAMUSCULAR | Status: DC | PRN

## 2014-07-28 MED ORDER — LIDOCAINE HCL (CARDIAC) 20 MG/ML IV SOLN
INTRAVENOUS | Status: DC | PRN
  Administered 2014-07-28: 60 mg via INTRAVENOUS

## 2014-07-28 MED ORDER — PROMETHAZINE HCL 25 MG/ML IJ SOLN: 6.2500 mg | INTRAMUSCULAR | Status: DC | PRN

## 2014-07-28 MED ORDER — PROPOFOL 10 MG/ML IV BOLUS
INTRAVENOUS | Status: DC | PRN
  Administered 2014-07-28: 150 mg via INTRAVENOUS

## 2014-07-28 MED ORDER — OXYCODONE-ACETAMINOPHEN 5-325 MG PO TABS: 1.0000 | ORAL_TABLET | ORAL | Status: DC | PRN

## 2014-07-28 MED ORDER — MEPERIDINE HCL 25 MG/ML IJ SOLN: 6.2500 mg | INTRAMUSCULAR | Status: DC | PRN

## 2014-07-28 MED ORDER — LIDOCAINE HCL 1 % IJ SOLN
INTRAMUSCULAR | Status: DC | PRN
  Administered 2014-07-28: 10 mL

## 2014-07-28 MED ORDER — KETOROLAC TROMETHAMINE 30 MG/ML IJ SOLN: 15.0000 mg | Freq: Once | INTRAMUSCULAR | Status: DC | PRN

## 2014-07-28 MED ORDER — FENTANYL CITRATE 0.05 MG/ML IJ SOLN
INTRAMUSCULAR | Status: DC | PRN
  Administered 2014-07-28 (×2): 25 ug via INTRAVENOUS
  Administered 2014-07-28: 50 ug via INTRAVENOUS

## 2014-07-28 MED ORDER — FENTANYL CITRATE 0.05 MG/ML IJ SOLN
INTRAMUSCULAR | Status: AC
  Filled 2014-07-28: qty 2

## 2014-07-28 MED ORDER — PHENYLEPHRINE 40 MCG/ML (10ML) SYRINGE FOR IV PUSH (FOR BLOOD PRESSURE SUPPORT)
PREFILLED_SYRINGE | INTRAVENOUS | Status: AC
  Filled 2014-07-28: qty 5

## 2014-07-28 MED ORDER — DEXAMETHASONE SODIUM PHOSPHATE 4 MG/ML IJ SOLN
INTRAMUSCULAR | Status: AC
Start: 2014-07-28 — End: 2014-07-28
  Filled 2014-07-28: qty 1

## 2014-07-28 MED ORDER — DEXAMETHASONE SODIUM PHOSPHATE 10 MG/ML IJ SOLN
INTRAMUSCULAR | Status: DC | PRN
  Administered 2014-07-28: 4 mg via INTRAVENOUS

## 2014-07-28 MED ORDER — FENTANYL CITRATE 0.05 MG/ML IJ SOLN: 25.0000 ug | INTRAMUSCULAR | Status: DC | PRN

## 2014-07-28 MED ORDER — PHENYLEPHRINE HCL 10 MG/ML IJ SOLN
INTRAMUSCULAR | Status: DC | PRN
  Administered 2014-07-28 (×3): 40 ug via INTRAVENOUS

## 2014-07-28 MED ORDER — LIDOCAINE HCL (CARDIAC) 20 MG/ML IV SOLN
INTRAVENOUS | Status: AC
  Filled 2014-07-28: qty 5

## 2014-07-28 MED ORDER — LACTATED RINGERS IV SOLN
INTRAVENOUS | Status: DC
  Administered 2014-07-28 (×2): via INTRAVENOUS

## 2014-07-28 MED ORDER — ONDANSETRON HCL 4 MG/2ML IJ SOLN
INTRAMUSCULAR | Status: DC | PRN
  Administered 2014-07-28: 4 mg via INTRAVENOUS

## 2014-07-28 MED ORDER — ONDANSETRON HCL 4 MG/2ML IJ SOLN
INTRAMUSCULAR | Status: AC
Start: 2014-07-28 — End: 2014-07-28
  Filled 2014-07-28: qty 2

## 2014-07-28 MED ORDER — LIDOCAINE HCL 1 % IJ SOLN
INTRAMUSCULAR | Status: AC
  Filled 2014-07-28: qty 20

## 2014-07-28 MED ORDER — PROPOFOL 10 MG/ML IV EMUL
INTRAVENOUS | Status: AC
  Filled 2014-07-28: qty 20

## 2014-07-28 MED ORDER — MIDAZOLAM HCL 2 MG/2ML IJ SOLN
INTRAMUSCULAR | Status: AC
  Filled 2014-07-28: qty 2

## 2014-07-28 MED ORDER — MIDAZOLAM HCL 2 MG/2ML IJ SOLN
INTRAMUSCULAR | Status: DC | PRN
  Administered 2014-07-28: 2 mg via INTRAVENOUS

## 2014-07-28 SURGICAL SUPPLY — 15 items
BLADE INCISOR TRUC PLUS 2.9 (ABLATOR) ×1 IMPLANT
CANISTERS HI-FLOW 3000CC (CANNISTER) ×2 IMPLANT
CATH ROBINSON RED A/P 16FR (CATHETERS) ×2 IMPLANT
CLOTH BEACON ORANGE TIMEOUT ST (SAFETY) ×2 IMPLANT
CONTAINER PREFILL 10% NBF 60ML (FORM) ×4 IMPLANT
DRAPE HYSTEROSCOPY (DRAPE) ×2 IMPLANT
GLOVE BIO SURGEON STRL SZ7.5 (GLOVE) ×2 IMPLANT
GOWN STRL REUS W/TWL LRG LVL3 (GOWN DISPOSABLE) ×4 IMPLANT
INCISOR TRUC PLUS BLADE 2.9 (ABLATOR) ×2
KIT HYSTEROSCOPY TRUCLEAR (ABLATOR) ×2 IMPLANT
MORCELLATOR RECIP TRUCLEAR 4.0 (ABLATOR) IMPLANT
PACK VAGINAL MINOR WOMEN LF (CUSTOM PROCEDURE TRAY) ×2 IMPLANT
PAD OB MATERNITY 4.3X12.25 (PERSONAL CARE ITEMS) ×2 IMPLANT
TOWEL OR 17X24 6PK STRL BLUE (TOWEL DISPOSABLE) ×4 IMPLANT
WATER STERILE IRR 1000ML POUR (IV SOLUTION) ×2 IMPLANT

## 2014-07-28 NOTE — Transfer of Care (Signed)
Immediate Anesthesia Transfer of Care Note  Patient: [name]  Procedure(s) Performed: Procedure(s): DILATATION & CURETTAGE/HYSTEROSCOPY WITH TRUCLEAR (N/A)  Patient Location: PACU  Anesthesia Type:General  Level of Consciousness: awake, alert , oriented and patient cooperative  Airway & Oxygen Therapy: Patient Spontanous Breathing and Patient connected to nasal cannula oxygen  Post-op Assessment: Report given to PACU RN and Post -op Vital signs reviewed and stable  Post vital signs: Reviewed and stable  Complications: No apparent anesthesia complications

## 2014-07-28 NOTE — Addendum Note (Signed)
Addendum created 07/28/14 7209 by Rudean Curt, MD   Modules edited: Orders, PRL Based Order Sets

## 2014-07-28 NOTE — H&P (Signed)
  The patient was examined.  I reviewed the proposed surgery and consent form with the patient.  The dictated history and physical is current and accurate and all questions were answered. The patient is ready to proceed with surgery and has a realistic understanding and expectation for the outcome.   Anastasio Auerbach MD, 7:04 AM 07/28/2014

## 2014-07-28 NOTE — Op Note (Signed)
[name] 08-31-1961 [id number]   Post Operative Note   Date of surgery:  07/28/2014  Pre Op Dx:  Postmenopausal bleeding, endometrial polyp  Post Op Dx:  Postmenopausal bleeding, endometrial polyp  Procedure:  Hysteroscopy, D&C, Truclear resection of endometrial polyp  Surgeon:  Anastasio Auerbach  Anesthesia:  General  EBL:  minimal  Distended media discrepancy:  50 cc  Complications:  None  Specimen:  #1 endometrial curettings #2 endometrial polyp to pathology  Findings: EUA:  External BUS vagina with atrophic changes. Cervix normal with atrophic changes. Uterus retroverted normal size midline mobile. Adnexa without masses   Hysteroscopy:  Adequate noting fundus, anterior/posterior endometrial surface, lower uterine segment, endocervical canal, right/left tubal ostia all visualized. 2 small polyps right lateral mid endometrial surface. Resected to the level of the surrounding endometrium. Endometrium otherwise atrophic in appearance.  Procedure:  The patient was taken to the operating room, underwent general anesthesia, was placed in the low dorsal lithotomy position, received a perineal/vaginal preparation with Betadine solution and the bladder was emptied with an in and out Foley catheterization per nursing personnel. The time out was performed by the surgical team. An EUA was performed and the patient was draped in the usual fashion. The cervix was visualized with a speculum, anterior lip grasped with a single-tooth tenaculum and a paracervical block using 1% lidocaine was placed, 10 cc total. The cervix was gently dilated to admit the small Truclear hysteroscope and hysteroscopy was performed with findings noted above. Using the small Truclear resectoscopic wand, the polyps were excised to the level of the surrounding endometrium. A gentle sharp curettage was then performed and both specimens were sent separately to pathology. Repeat hysteroscopy showed an empty cavity, good  distention, no evidence of perforation and adequate hemostasis. The tenaculum was removed and hemostasis was visualized at both the tenaculum sites and the external cervical os. The patient received intraoperative Toradol, was placed in the supine position, was awakened without difficulty and taken to the recovery room in good condition having tolerated the procedure well.     Anastasio Auerbach MD, 8:25 AM 07/28/2014

## 2014-07-28 NOTE — Anesthesia Postprocedure Evaluation (Signed)
Anesthesia Post Note  Patient: [name]  Procedure(s) Performed: Procedure(s) (LRB): DILATATION & CURETTAGE/HYSTEROSCOPY WITH TRUCLEAR (N/A)  Anesthesia type: GA  Patient location: PACU  Post pain: Pain level controlled  Post assessment: Post-op Vital signs reviewed  Last Vitals:  Filed Vitals:   07/28/14 0810  BP: 102/52  Pulse: 72  Temp: 36.7 C  Resp: 16    Post vital signs: Reviewed  Level of consciousness: sedated  Complications: No apparent anesthesia complications

## 2014-07-28 NOTE — Anesthesia Preprocedure Evaluation (Signed)
Anesthesia Evaluation  Patient identified by MRN, date of birth, ID band Patient awake    Reviewed: Allergy & Precautions, H&P , NPO status , Patient's Chart, lab work & pertinent test results  Airway       Dental   Pulmonary neg pulmonary ROS,          Cardiovascular negative cardio ROS      Neuro/Psych negative neurological ROS  negative psych ROS   GI/Hepatic negative GI ROS, Neg liver ROS,   Endo/Other  negative endocrine ROS  Renal/GU negative Renal ROS  negative genitourinary   Musculoskeletal negative musculoskeletal ROS (+)   Abdominal   Peds  Hematology negative hematology ROS (+)   Anesthesia Other Findings   Reproductive/Obstetrics Endometrial polyp                           Anesthesia Physical Anesthesia Plan  ASA: I  Anesthesia Plan: General   Post-op Pain Management:    Induction: Intravenous  Airway Management Planned: LMA  Additional Equipment:   Intra-op Plan:   Post-operative Plan: Extubation in OR  Informed Consent: I have reviewed the patients History and Physical, chart, labs and discussed the procedure including the risks, benefits and alternatives for the proposed anesthesia with the patient or authorized representative who has indicated his/her understanding and acceptance.   Dental advisory given  Plan Discussed with: Anesthesiologist, CRNA and Surgeon  Anesthesia Plan Comments:         Anesthesia Quick Evaluation

## 2014-07-28 NOTE — Discharge Instructions (Signed)
Postoperative Instructions Hysteroscopy D & C  Dr. Phineas Real and the nursing staff have discussed postoperative instructions with you.  If you have any questions please ask them before you leave the hospital, or call Dr Elisabeth Most office at [phone].    We would like to emphasize the following instructions:     Call the office to make your follow-up appointment as recommended by Dr Phineas Real (usually 1-2 weeks).    You were given a prescription, or one was ordered for you at the pharmacy you designated.  Get that prescription filled and take the medication according to instructions.    You may eat a regular diet, but slowly until you start having bowel movements.    Drink plenty of water daily.    Nothing in the vagina (intercourse, douching, objects of any kind) for two weeks.  When reinitiating intercourse, if it is uncomfortable, stop and make an appointment with Dr Phineas Real to be evaluated.    No driving for one to two days until the effects of anesthesia has worn off.  No traveling out of town for several days.    You may shower, but no baths for one week.  Walking up and down stairs is ok.  No heavy lifting, prolonged standing, repeated bending or any working out until your post op check.    Rest frequently, listen to your body and do not push yourself and overdo it.    Call if:  o Your pain medication does not seem strong enough. o Worsening pain or abdominal bloating o Persistent nausea or vomiting o Difficulty with urination or bowel movements. o Temperature of 101 degrees or higher. o Heavy vaginal bleeding.  If your period is due, you may use tampons. o You have any questions or concerns   DISCHARGE INSTRUCTIONS: HYSTEROSCOPY / ENDOMETRIAL ABLATION The following instructions have been prepared to help you care for yourself upon your return home.  Personal hygiene:  Use sanitary pads for vaginal drainage, not tampons.  Shower the day after your procedure.   NO tub baths, pools or Jacuzzis for 2-3 weeks.  Wipe front to back after using the bathroom.  Activity and limitations:  Do NOT drive or operate any equipment for 24 hours. The effects of anesthesia are still present and drowsiness may result.  Do NOT rest in bed all day.  Walking is encouraged.  Walk up and down stairs slowly.  You may resume your normal activity in one to two days or as indicated by your physician. Sexual activity: NO intercourse for at least 2 weeks after the procedure, or as indicated by your Doctor.  Diet: Eat a light meal as desired this evening. You may resume your usual diet tomorrow.  Return to Work: You may resume your work activities in one to two days or as indicated by Marine scientist.  What to expect after your surgery: Expect to have vaginal bleeding/discharge for 2-3 days and spotting for up to 10 days. It is not unusual to have soreness for up to 1-2 weeks. You may have a slight burning sensation when you urinate for the first day. Mild cramps may continue for a couple of days. You may have a regular period in 2-6 weeks.  Call your doctor for any of the following:  Excessive vaginal bleeding or clotting, saturating and changing one pad every hour.  Inability to urinate 6 hours after discharge from hospital.  Pain not relieved by pain medication.  Fever of 100.4 F or greater.  Unusual vaginal discharge or odor.  Return to office _________________Call for an appointment ___________________ Patients signature: ______________________ Nurses signature ________________________  [facility] [phone]

## 2014-07-29 ENCOUNTER — Encounter (HOSPITAL_COMMUNITY): Payer: Self-pay | Admitting: Gynecology

## 2014-08-12 ENCOUNTER — Encounter: Payer: Self-pay | Admitting: Gynecology

## 2014-08-12 ENCOUNTER — Ambulatory Visit (INDEPENDENT_AMBULATORY_CARE_PROVIDER_SITE_OTHER): Payer: BC Managed Care – PPO | Admitting: Gynecology

## 2014-08-12 DIAGNOSIS — Z9889 Other specified postprocedural states: Secondary | ICD-10-CM

## 2014-08-12 NOTE — Patient Instructions (Signed)
Follow up for your annual exam in March 2016. Sooner if any issues.

## 2014-08-12 NOTE — Progress Notes (Signed)
NAIDA ESCALANTE Jun 06, 1961 [id number]        53 y.o.  G4P4 returns postoperative status post hysteroscopic resection endometrial polyps. Has done well without complaints.  Past medical history,surgical history, problem list, medications, allergies, family history and social history were all reviewed and documented in the EPIC chart.  Directed ROS with pertinent positives and negatives documented in the history of present illness/assessment and plan.  Exam: [name] assistant General appearance:  Normal External BUS vagina normal. Cervix normal. Uterus retroverted normal size midline mobile nontender. Adnexa without masses or tenderness.  Assessment/Plan:  [name] status post hysteroscopic endometrial polyp. Doing well. Reviewed pathology and pictures from the surgery. Final pathology showed benign endometrial polyp. Patient will keep bleeding calender assuming no further bleeding and doing well then she'll see me March 2016 for her annual exam she is due.     Anastasio Auerbach MD, 2:31 PM 08/12/2014

## 2014-08-23 ENCOUNTER — Encounter: Payer: Self-pay | Admitting: Gynecology

## 2014-11-17 ENCOUNTER — Encounter: Payer: Self-pay | Admitting: Gynecology

## 2014-11-29 ENCOUNTER — Encounter: Payer: Self-pay | Admitting: Gynecology

## 2014-12-27 ENCOUNTER — Encounter: Payer: Self-pay | Admitting: Gynecology

## 2014-12-31 ENCOUNTER — Encounter: Payer: Self-pay | Admitting: Gynecology

## 2014-12-31 ENCOUNTER — Ambulatory Visit (INDEPENDENT_AMBULATORY_CARE_PROVIDER_SITE_OTHER): Payer: Self-pay | Admitting: Gynecology

## 2014-12-31 ENCOUNTER — Other Ambulatory Visit: Payer: Self-pay | Admitting: Gynecology

## 2014-12-31 VITALS — BP 120/70 | Ht 63.0 in | Wt 146.0 lb

## 2014-12-31 DIAGNOSIS — E559 Vitamin D deficiency, unspecified: Secondary | ICD-10-CM

## 2014-12-31 DIAGNOSIS — Z01419 Encounter for gynecological examination (general) (routine) without abnormal findings: Secondary | ICD-10-CM

## 2014-12-31 LAB — COMPREHENSIVE METABOLIC PANEL
ALT: 11 U/L (ref 0–35)
AST: 19 U/L (ref 0–37)
Albumin: 4.3 g/dL (ref 3.5–5.2)
Alkaline Phosphatase: 63 U/L (ref 39–117)
BUN: 15 mg/dL (ref 6–23)
CALCIUM: 9.3 mg/dL (ref 8.4–10.5)
CHLORIDE: 104 meq/L (ref 96–112)
CO2: 30 meq/L (ref 19–32)
CREATININE: 0.86 mg/dL (ref 0.50–1.10)
Glucose, Bld: 78 mg/dL (ref 70–99)
Potassium: 4.5 mEq/L (ref 3.5–5.3)
Sodium: 141 mEq/L (ref 135–145)
Total Bilirubin: 0.6 mg/dL (ref 0.2–1.2)
Total Protein: 6.9 g/dL (ref 6.0–8.3)

## 2014-12-31 LAB — CBC WITH DIFFERENTIAL/PLATELET
BASOS PCT: 1 % (ref 0–1)
Basophils Absolute: 0.1 10*3/uL (ref 0.0–0.1)
Eosinophils Absolute: 0.3 10*3/uL (ref 0.0–0.7)
Eosinophils Relative: 5 % (ref 0–5)
HCT: 41.1 % (ref 36.0–46.0)
Hemoglobin: 13.8 g/dL (ref 12.0–15.0)
Lymphocytes Relative: 34 % (ref 12–46)
Lymphs Abs: 1.7 10*3/uL (ref 0.7–4.0)
MCH: 28.8 pg (ref 26.0–34.0)
MCHC: 33.6 g/dL (ref 30.0–36.0)
MCV: 85.8 fL (ref 78.0–100.0)
MPV: 9.5 fL (ref 8.6–12.4)
Monocytes Absolute: 0.4 10*3/uL (ref 0.1–1.0)
Monocytes Relative: 7 % (ref 3–12)
NEUTROS PCT: 53 % (ref 43–77)
Neutro Abs: 2.7 10*3/uL (ref 1.7–7.7)
Platelets: 459 10*3/uL — ABNORMAL HIGH (ref 150–400)
RBC: 4.79 MIL/uL (ref 3.87–5.11)
RDW: 13.8 % (ref 11.5–15.5)
WBC: 5 10*3/uL (ref 4.0–10.5)

## 2014-12-31 LAB — LIPID PANEL
Cholesterol: 189 mg/dL (ref 0–200)
HDL: 65 mg/dL (ref >=46)
LDL Cholesterol: 111 mg/dL — ABNORMAL HIGH (ref 0–99)
TRIGLYCERIDES: 63 mg/dL (ref <150)
Total CHOL/HDL Ratio: 2.9 Ratio
VLDL: 13 mg/dL (ref 0–40)

## 2014-12-31 LAB — VITAMIN D 25 HYDROXY (VIT D DEFICIENCY, FRACTURES): Vit D, 25-Hydroxy: 26 ng/mL — ABNORMAL LOW (ref 30–100)

## 2014-12-31 LAB — TSH: TSH: 1.92 u[IU]/mL (ref 0.350–4.500)

## 2014-12-31 NOTE — Progress Notes (Signed)
[name] [birth year] [id number]        54 y.o.  G4P4 for annual exam.  Doing well.  Past medical history,surgical history, problem list, medications, allergies, family history and social history were all reviewed and documented as reviewed in the EPIC chart.  ROS:  Performed with pertinent positives and negatives included in the history, assessment and plan.   Additional significant findings :  none   Exam: [name] Counsellor Vitals:   12/31/14 0832  BP: 120/70  Height: 5\' 3"  (1.6 m)  Weight: 146 lb (66.225 kg)   General appearance:  Normal affect, orientation and appearance. Skin: Grossly normal HEENT: Without gross lesions.  No cervical or supraclavicular adenopathy. Thyroid normal.  Lungs:  Clear without wheezing, rales or rhonchi Cardiac: RR, without RMG Abdominal:  Soft, nontender, without masses, guarding, rebound, organomegaly or hernia Breasts:  Examined lying and sitting without masses, retractions, discharge or axillary adenopathy. Pelvic:  Ext/BUS/vagina normal  Cervix normal  Uterus retroverted, normal size, shape and contour, midline and mobile nontender   Adnexa  Without masses or tenderness    Anus and perineum  Normal   Rectovaginal  Normal sphincter tone without palpated masses or tenderness.    Assessment/Plan:  54 y.o. G12P4 female for annual exam.   1. Postmenopausal.  Doing well without significant hot flushes, night sweats, vaginal dryness. Had hysteroscopy D&C this past fall for endometrial polyp. Has done well with no bleeding since. Continue to monitor and report any vaginal bleeding. 2. Pap smear/HPV negative 12/2013. No Pap smear done today. No history of significant abnormal Pap smears. Plan repeat Pap smear at 3-5 year interval per current screening guidelines. 3. Mammography 10/2014. Continue with annual mammography. SBE monthly reviewed. 4. Colonoscopy never. Recommended screening colonoscopy. Benefits of  precancerous polyp removal and early  detection reviewed. Patient agrees to schedule and names and numbers provided. 5. DEXA never. Will plan further into the menopause. Check vitamin D level today. 6. Health maintenance. Baseline CBC, comprehensive metabolic panel, lipid profile, urinalysis, TSH and vitamin D ordered. Follow up in one year, sooner as needed.    Anastasio Auerbach MD, 8:53 AM 12/31/2014

## 2014-12-31 NOTE — Patient Instructions (Signed)
Schedule colonoscopy with [facility] at [phone] or [facility]ology at [phone]  You may obtain a copy of any labs that were done today by logging onto MyChart as outlined in the instructions provided with your AVS (after visit summary). The office will not call with normal lab results but certainly if there are any significant abnormalities then we will contact you.   Health Maintenance, Female A healthy lifestyle and preventative care can promote health and wellness.  Maintain regular health, dental, and eye exams.  Eat a healthy diet. Foods like vegetables, fruits, whole grains, low-fat dairy products, and lean protein foods contain the nutrients you need without too many calories. Decrease your intake of foods high in solid fats, added sugars, and salt. Get information about a proper diet from your caregiver, if necessary.  Regular physical exercise is one of the most important things you can do for your health. Most adults should get at least 150 minutes of moderate-intensity exercise (any activity that increases your heart rate and causes you to sweat) each week. In addition, most adults need muscle-strengthening exercises on 2 or more days a week.   Maintain a healthy weight. The body mass index (BMI) is a screening tool to identify possible weight problems. It provides an estimate of body fat based on height and weight. Your caregiver can help determine your BMI, and can help you achieve or maintain a healthy weight. For adults 20 years and older:  A BMI below 18.5 is considered underweight.  A BMI of 18.5 to 24.9 is normal.  A BMI of 25 to 29.9 is considered overweight.  A BMI of 30 and above is considered obese.  Maintain normal blood lipids and cholesterol by exercising and minimizing your intake of saturated fat. Eat a balanced diet with plenty of fruits and vegetables. Blood tests for lipids and cholesterol should begin at age 64 and be repeated every  5 years. If your lipid or cholesterol levels are high, you are over 50, or you are a high risk for heart disease, you may need your cholesterol levels checked more frequently.Ongoing high lipid and cholesterol levels should be treated with medicines if diet and exercise are not effective.  If you smoke, find out from your caregiver how to quit. If you do not use tobacco, do not start.  Lung cancer screening is recommended for adults aged 58 80 years who are at high risk for developing lung cancer because of a history of smoking. Yearly low-dose computed tomography (CT) is recommended for people who have at least a 30-pack-year history of smoking and are a current smoker or have quit within the past 15 years. A pack year of smoking is smoking an average of 1 pack of cigarettes a day for 1 year (for example: 1 pack a day for 30 years or 2 packs a day for 15 years). Yearly screening should continue until the smoker has stopped smoking for at least 15 years. Yearly screening should also be stopped for people who develop a health problem that would prevent them from having lung cancer treatment.  If you are pregnant, do not drink alcohol. If you are breastfeeding, be very cautious about drinking alcohol. If you are not pregnant and choose to drink alcohol, do not exceed 1 drink per day. One drink is considered to be 12 ounces (355 mL) of beer, 5 ounces (148 mL) of wine, or 1.5 ounces (44 mL) of liquor.  Avoid use of street drugs. Do not share needles  with anyone. Ask for help if you need support or instructions about stopping the use of drugs.  High blood pressure causes heart disease and increases the risk of stroke. Blood pressure should be checked at least every 1 to 2 years. Ongoing high blood pressure should be treated with medicines, if weight loss and exercise are not effective.  If you are 55 to 54 years old, ask your caregiver if you should take aspirin to prevent strokes.  Diabetes screening  involves taking a blood sample to check your fasting blood sugar level. This should be done once every 3 years, after age 45, if you are within normal weight and without risk factors for diabetes. Testing should be considered at a younger age or be carried out more frequently if you are overweight and have at least 1 risk factor for diabetes.  Breast cancer screening is essential preventative care for women. You should practice "breast self-awareness." This means understanding the normal appearance and feel of your breasts and may include breast self-examination. Any changes detected, no matter how small, should be reported to a caregiver. Women in their 20s and 30s should have a clinical breast exam (CBE) by a caregiver as part of a regular health exam every 1 to 3 years. After age 40, women should have a CBE every year. Starting at age 40, women should consider having a mammogram (breast X-ray) every year. Women who have a family history of breast cancer should talk to their caregiver about genetic screening. Women at a high risk of breast cancer should talk to their caregiver about having an MRI and a mammogram every year.  Breast cancer gene (BRCA)-related cancer risk assessment is recommended for women who have family members with BRCA-related cancers. BRCA-related cancers include breast, ovarian, tubal, and peritoneal cancers. Having family members with these cancers may be associated with an increased risk for harmful changes (mutations) in the breast cancer genes BRCA1 and BRCA2. Results of the assessment will determine the need for genetic counseling and BRCA1 and BRCA2 testing.  The Pap test is a screening test for cervical cancer. Women should have a Pap test starting at age 21. Between ages 21 and 29, Pap tests should be repeated every 2 years. Beginning at age 30, you should have a Pap test every 3 years as long as the past 3 Pap tests have been normal. If you had a hysterectomy for a problem that  was not cancer or a condition that could lead to cancer, then you no longer need Pap tests. If you are between ages 65 and 70, and you have had normal Pap tests going back 10 years, you no longer need Pap tests. If you have had past treatment for cervical cancer or a condition that could lead to cancer, you need Pap tests and screening for cancer for at least 20 years after your treatment. If Pap tests have been discontinued, risk factors (such as a new sexual partner) need to be reassessed to determine if screening should be resumed. Some women have medical problems that increase the chance of getting cervical cancer. In these cases, your caregiver may recommend more frequent screening and Pap tests.  The human papillomavirus (HPV) test is an additional test that may be used for cervical cancer screening. The HPV test looks for the virus that can cause the cell changes on the cervix. The cells collected during the Pap test can be tested for HPV. The HPV test could be used to screen women aged   30 years and older, and should be used in women of any age who have unclear Pap test results. After the age of 30, women should have HPV testing at the same frequency as a Pap test.  Colorectal cancer can be detected and often prevented. Most routine colorectal cancer screening begins at the age of 50 and continues through age 75. However, your caregiver may recommend screening at an earlier age if you have risk factors for colon cancer. On a yearly basis, your caregiver may provide home test kits to check for hidden blood in the stool. Use of a small camera at the end of a tube, to directly examine the colon (sigmoidoscopy or colonoscopy), can detect the earliest forms of colorectal cancer. Talk to your caregiver about this at age 50, when routine screening begins. Direct examination of the colon should be repeated every 5 to 10 years through age 75, unless early forms of pre-cancerous polyps or small growths are  found.  Hepatitis C blood testing is recommended for all people born from 1945 through 1965 and any individual with known risks for hepatitis C.  Practice safe sex. Use condoms and avoid high-risk sexual practices to reduce the spread of sexually transmitted infections (STIs). Sexually active women aged 25 and younger should be checked for Chlamydia, which is a common sexually transmitted infection. Older women with new or multiple partners should also be tested for Chlamydia. Testing for other STIs is recommended if you are sexually active and at increased risk.  Osteoporosis is a disease in which the bones lose minerals and strength with aging. This can result in serious bone fractures. The risk of osteoporosis can be identified using a bone density scan. Women ages 65 and over and women at risk for fractures or osteoporosis should discuss screening with their caregivers. Ask your caregiver whether you should be taking a calcium supplement or vitamin D to reduce the rate of osteoporosis.  Menopause can be associated with physical symptoms and risks. Hormone replacement therapy is available to decrease symptoms and risks. You should talk to your caregiver about whether hormone replacement therapy is right for you.  Use sunscreen. Apply sunscreen liberally and repeatedly throughout the day. You should seek shade when your shadow is shorter than you. Protect yourself by wearing long sleeves, pants, a wide-brimmed hat, and sunglasses year round, whenever you are outdoors.  Notify your caregiver of new moles or changes in moles, especially if there is a change in shape or color. Also notify your caregiver if a mole is larger than the size of a pencil eraser.  Stay current with your immunizations. Document Released: 04/23/2011 Document Revised: 02/02/2013 Document Reviewed: 04/23/2011 [employer] Patient Information 2014 [employer].   

## 2015-01-01 LAB — URINALYSIS W MICROSCOPIC + REFLEX CULTURE
BILIRUBIN URINE: NEGATIVE
Bacteria, UA: NONE SEEN
CASTS: NONE SEEN
CRYSTALS: NONE SEEN
Glucose, UA: NEGATIVE mg/dL
Hgb urine dipstick: NEGATIVE
KETONES UR: NEGATIVE mg/dL
LEUKOCYTES UA: NEGATIVE
Nitrite: NEGATIVE
Protein, ur: NEGATIVE mg/dL
Specific Gravity, Urine: <1.005 — ABNORMAL LOW (ref 1.005–1.030)
Squamous Epithelial / LPF: NONE SEEN
UROBILINOGEN UA: 0.2 mg/dL (ref 0.0–1.0)
pH: 6.5 (ref 5.0–8.0)

## 2015-08-05 ENCOUNTER — Other Ambulatory Visit: Payer: Self-pay

## 2015-08-05 DIAGNOSIS — E559 Vitamin D deficiency, unspecified: Secondary | ICD-10-CM

## 2015-08-06 LAB — VITAMIN D 25 HYDROXY (VIT D DEFICIENCY, FRACTURES): Vit D, 25-Hydroxy: 42 ng/mL (ref 30–100)

## 2015-08-16 ENCOUNTER — Telehealth: Payer: Self-pay | Admitting: *Deleted

## 2015-08-16 NOTE — Telephone Encounter (Signed)
Pt called requesting Vitamin D results from 08/07/15, pt aware normal result.

## 2015-11-25 ENCOUNTER — Encounter: Payer: Self-pay | Admitting: Gynecology

## 2016-09-18 ENCOUNTER — Encounter: Payer: Self-pay | Admitting: Gynecology

## 2016-09-18 ENCOUNTER — Ambulatory Visit (INDEPENDENT_AMBULATORY_CARE_PROVIDER_SITE_OTHER): Payer: Self-pay | Admitting: Gynecology

## 2016-09-18 VITALS — BP 116/70 | Ht 63.0 in | Wt 139.0 lb

## 2016-09-18 DIAGNOSIS — Z01419 Encounter for gynecological examination (general) (routine) without abnormal findings: Secondary | ICD-10-CM

## 2016-09-18 DIAGNOSIS — Z1321 Encounter for screening for nutritional disorder: Secondary | ICD-10-CM

## 2016-09-18 DIAGNOSIS — R300 Dysuria: Secondary | ICD-10-CM

## 2016-09-18 DIAGNOSIS — Z13220 Encounter for screening for lipoid disorders: Secondary | ICD-10-CM

## 2016-09-18 DIAGNOSIS — N952 Postmenopausal atrophic vaginitis: Secondary | ICD-10-CM

## 2016-09-18 DIAGNOSIS — N3000 Acute cystitis without hematuria: Secondary | ICD-10-CM

## 2016-09-18 DIAGNOSIS — Z1329 Encounter for screening for other suspected endocrine disorder: Secondary | ICD-10-CM

## 2016-09-18 LAB — URINALYSIS W MICROSCOPIC + REFLEX CULTURE
Bilirubin Urine: NEGATIVE
Casts: NONE SEEN [LPF]
Crystals: NONE SEEN [HPF]
Glucose, UA: NEGATIVE
Ketones, ur: NEGATIVE
NITRITE: NEGATIVE
Protein, ur: NEGATIVE
SPECIFIC GRAVITY, URINE: 1.01 (ref 1.001–1.035)
YEAST: NONE SEEN [HPF]
pH: 5 (ref 5.0–8.0)

## 2016-09-18 MED ORDER — FOSFOMYCIN TROMETHAMINE 3 G PO PACK: 3.0000 g | PACK | Freq: Once | ORAL | Status: DC

## 2016-09-18 NOTE — Patient Instructions (Signed)

## 2016-09-18 NOTE — Progress Notes (Signed)
    [name] Jul 19, 1961 [id number]        55 y.o.  G4P4  for annual exam.  Also notes:  1.  This past weekend started having some mild urinary burning. No fever chills nausea vomiting. No frequency urgency low back pain. 2. Vaginal dryness. Seems to be slowly getting worse. Had questions about what her options would be. No hot flushes night sweats or any vaginal bleeding.  Past medical history,surgical history, problem list, medications, allergies, family history and social history were all reviewed and documented as reviewed in the EPIC chart.  ROS:  Performed with pertinent positives and negatives included in the history, assessment and plan.   Additional significant findings :  None   Exam: Caryn Bee assistant Vitals:   09/18/16 1557  BP: 116/70  Weight: 139 lb (63 kg)  Height: 5\' 3"  (1.6 m)   Body mass index is 24.62 kg/m.  General appearance:  Normal affect, orientation and appearance. Skin: Grossly normal HEENT: Without gross lesions.  No cervical or supraclavicular adenopathy. Thyroid normal.  Lungs:  Clear without wheezing, rales or rhonchi Cardiac: RR, without RMG Abdominal:  Soft, nontender, without masses, guarding, rebound, organomegaly or hernia Breasts:  Examined lying and sitting without masses, retractions, discharge or axillary adenopathy. Pelvic:  Ext, BUS, Vagina with atrophic changes  Cervix with atrophic changes  Uterus anteverted, normal size, shape and contour, midline and mobile nontender   Adnexa without masses or tenderness    Anus and perineum normal   Rectovaginal normal sphincter tone without palpated masses or tenderness.    Assessment/Plan:  55 y.o. G17P4 female for annual exam.   1. UTI. Patient with dysuria this past weekend. Has been pushing fluids. We'll treat with Monurol given her allergies. Follow up if symptoms persist, worsen or recur. 2. Vaginal dryness. Options to include moisturizers versus lubricants OTC discussed.  Vaginal estrogen to include cream, Vagifem and ratings also reviewed. Lastly I reviewed Osphena. The pros/cons of these choice discussed. Issues of absorption with systemic effects such as thrombosis, breast stimulation and endometrial stimulation also discussed. At this point patient wants to try OTC products. Will follow up if his and issue. 3. Decreased libido. Reviewed the menopausal transition and issues with decreased libido. Option for management reviewed to include Adyyi and testosterone supplementation. The risks/benefits of each choice discussed. At this point the patient is not interested in trying anything but will follow up if it is an issue. 4. Mammography 10/2015. Continue with annual mammography when due. SBE monthly reviewed. 5. Pap smear/HPV 2015 negative. No Pap smear done today. No history of significant abnormal Pap smears. Plan repeat Pap smear at 5 year interval per current screening guidelines. 6. Colonoscopy never. I again recommended she schedule a screening colonoscopy. Benefits of early detection and precancerous polyp removal discussed. Patient acknowledges my recommendations. 7. DEXA never. Will plan further into the menopause. Increased calcium vitamin D. Check vitamin D level. 8. Health maintenance. Future orders placed for CBC, CMP, lipid profile, vitamin D and TSH. Patient will return fasting. Follow up in one year, sooner if any issues.  Additional time in excess of her routine gynecologic exam was spent in direct face to face counseling and coordination of care in regards to her UTI, vaginal dryness and decreased libido.    Anastasio Auerbach MD, 4:37 PM 09/18/2016

## 2016-09-19 ENCOUNTER — Telehealth: Payer: Self-pay | Admitting: *Deleted

## 2016-09-19 MED ORDER — CIPROFLOXACIN HCL 250 MG PO TABS: 250.0000 mg | ORAL_TABLET | Freq: Two times a day (BID) | ORAL | 0 refills | Status: DC

## 2016-09-19 NOTE — Telephone Encounter (Signed)
ciprofloxacin 250 mg twice a day 3 days

## 2016-09-19 NOTE — Telephone Encounter (Signed)
Pt informed with the below note, Rx sent. 

## 2016-09-19 NOTE — Telephone Encounter (Signed)
Pt was seen yesterday for UTI prescribed Monurol 3 g packet states medication was too expensive, states you told her to call if too expensive and Rx for Cipro would be sent in. Please advise

## 2016-09-20 LAB — URINE CULTURE

## 2016-10-01 ENCOUNTER — Other Ambulatory Visit: Payer: Self-pay

## 2016-10-01 DIAGNOSIS — Z1321 Encounter for screening for nutritional disorder: Secondary | ICD-10-CM

## 2016-10-01 DIAGNOSIS — Z01419 Encounter for gynecological examination (general) (routine) without abnormal findings: Secondary | ICD-10-CM

## 2016-10-01 DIAGNOSIS — Z13220 Encounter for screening for lipoid disorders: Secondary | ICD-10-CM

## 2016-10-01 DIAGNOSIS — Z1329 Encounter for screening for other suspected endocrine disorder: Secondary | ICD-10-CM

## 2016-10-01 LAB — COMPREHENSIVE METABOLIC PANEL
ALT: 12 U/L (ref 6–29)
AST: 21 U/L (ref 10–35)
Albumin: 4.6 g/dL (ref 3.6–5.1)
Alkaline Phosphatase: 59 U/L (ref 33–130)
BILIRUBIN TOTAL: 0.5 mg/dL (ref 0.2–1.2)
BUN: 11 mg/dL (ref 7–25)
CO2: 28 mmol/L (ref 20–31)
CREATININE: 0.8 mg/dL (ref 0.50–1.05)
Calcium: 9.4 mg/dL (ref 8.6–10.4)
Chloride: 107 mmol/L (ref 98–110)
GLUCOSE: 88 mg/dL (ref 65–99)
Potassium: 4.8 mmol/L (ref 3.5–5.3)
SODIUM: 141 mmol/L (ref 135–146)
Total Protein: 6.9 g/dL (ref 6.1–8.1)

## 2016-10-01 LAB — CBC WITH DIFFERENTIAL/PLATELET
BASOS PCT: 1 %
Basophils Absolute: 49 cells/uL (ref 0–200)
EOS PCT: 3 %
Eosinophils Absolute: 147 cells/uL (ref 15–500)
HCT: 42.1 % (ref 35.0–45.0)
Hemoglobin: 13.7 g/dL (ref 11.7–15.5)
LYMPHS ABS: 1470 {cells}/uL (ref 850–3900)
LYMPHS PCT: 30 %
MCH: 28.5 pg (ref 27.0–33.0)
MCHC: 32.5 g/dL (ref 32.0–36.0)
MCV: 87.5 fL (ref 80.0–100.0)
MPV: 9.6 fL (ref 7.5–12.5)
Monocytes Absolute: 294 cells/uL (ref 200–950)
Monocytes Relative: 6 %
NEUTROS PCT: 60 %
Neutro Abs: 2940 cells/uL (ref 1500–7800)
Platelets: 467 10*3/uL — ABNORMAL HIGH (ref 140–400)
RBC: 4.81 MIL/uL (ref 3.80–5.10)
RDW: 13.9 % (ref 11.0–15.0)
WBC: 4.9 10*3/uL (ref 3.8–10.8)

## 2016-10-01 LAB — LIPID PANEL
Cholesterol: 183 mg/dL (ref <200)
HDL: 64 mg/dL (ref >50)
LDL CALC: 107 mg/dL — AB (ref <100)
Total CHOL/HDL Ratio: 2.9 Ratio (ref <5.0)
Triglycerides: 58 mg/dL (ref <150)
VLDL: 12 mg/dL (ref <30)

## 2016-10-01 LAB — TSH: TSH: 2.54 mIU/L

## 2016-10-02 LAB — VITAMIN D 25 HYDROXY (VIT D DEFICIENCY, FRACTURES): Vit D, 25-Hydroxy: 39 ng/mL (ref 30–100)

## 2016-11-16 ENCOUNTER — Encounter: Payer: Self-pay | Admitting: Gynecology

## 2016-11-20 ENCOUNTER — Other Ambulatory Visit: Payer: Self-pay | Admitting: Internal Medicine

## 2017-09-16 ENCOUNTER — Encounter: Payer: Self-pay | Admitting: Gynecology

## 2017-09-17 ENCOUNTER — Encounter: Payer: Self-pay | Admitting: Gynecology

## 2017-09-19 ENCOUNTER — Encounter: Payer: Self-pay | Admitting: Gynecology

## 2017-10-25 ENCOUNTER — Encounter: Payer: Self-pay | Admitting: Gynecology

## 2017-10-30 ENCOUNTER — Encounter: Payer: Self-pay | Admitting: Gynecology

## 2017-11-15 ENCOUNTER — Encounter: Payer: Self-pay | Admitting: Gynecology

## 2017-11-15 ENCOUNTER — Ambulatory Visit (INDEPENDENT_AMBULATORY_CARE_PROVIDER_SITE_OTHER): Payer: Self-pay | Admitting: Gynecology

## 2017-11-15 VITALS — BP 118/76 | Ht 63.0 in | Wt 139.0 lb

## 2017-11-15 DIAGNOSIS — Z1321 Encounter for screening for nutritional disorder: Secondary | ICD-10-CM

## 2017-11-15 DIAGNOSIS — N952 Postmenopausal atrophic vaginitis: Secondary | ICD-10-CM

## 2017-11-15 DIAGNOSIS — Z13220 Encounter for screening for lipoid disorders: Secondary | ICD-10-CM

## 2017-11-15 DIAGNOSIS — Z01411 Encounter for gynecological examination (general) (routine) with abnormal findings: Secondary | ICD-10-CM

## 2017-11-15 NOTE — Patient Instructions (Signed)
Follow-up in 1 year for annual exam, sooner as needed. 

## 2017-11-15 NOTE — Progress Notes (Signed)
    [name] March 29, 1961 [id number]        57 y.o.  G4P4 for annual gynecologic exam.  Continue to have issues with vaginal dryness with dyspareunia.  Past medical history,surgical history, problem list, medications, allergies, family history and social history were all reviewed and documented as reviewed in the EPIC chart.  ROS:  Performed with pertinent positives and negatives included in the history, assessment and plan.   Additional significant findings : None   Exam: Caryn Bee assistant Vitals:   11/15/17 0805  BP: 118/76  Weight: 139 lb (63 kg)  Height: 5\' 3"  (1.6 m)   Body mass index is 24.62 kg/m.  General appearance:  Normal affect, orientation and appearance. Skin: Grossly normal HEENT: Without gross lesions.  No cervical or supraclavicular adenopathy. Thyroid normal.  Lungs:  Clear without wheezing, rales or rhonchi Cardiac: RR, without RMG Abdominal:  Soft, nontender, without masses, guarding, rebound, organomegaly or hernia Breasts:  Examined lying and sitting without masses, retractions, discharge or axillary adenopathy. Pelvic:  Ext, BUS, Vagina: With atrophic changes  Cervix: With atrophic changes  Uterus: Anteverted, normal size, shape and contour, midline and mobile nontender   Adnexa: Without masses or tenderness    Anus and perineum: Normal   Rectovaginal: Normal sphincter tone without palpated masses or tenderness.    Assessment/Plan:  57 y.o. G44P4 female for annual gynecologic exam.   1. Postmenopausal/atrophic genital changes.  Continues with dyspareunia.  Is not having dryness on a daily basis.  Currently using OTC products.  We again discussed in detail her options to include continuing OTC products, vaginal estrogen such as creams, ring, tablets, suppositories, intrarosa, Osphena, [employer] laser.  The pros and cons of each choice reviewed.  Systemic effects with absorption particularly estrogen products discussed to include thrombosis, breast  and endometrial stimulation.  At this point the patient wants to continue OTC products.  Will call if she changes her mind. 2. Pap smear/HPV 2015.  No Pap smear done today.  No history of significant abnormal Pap smears.  Plan repeat Pap smear next year at 5-year interval per current screening guidelines. 3. Colonoscopy in the process of being arranged. 4. DEXA never.  Will plan further into the menopause.  Check vitamin D level today.  Was borderline last year. 5. Mammography coming due and patient knows to schedule.  Breast exam normal today. 6. Health maintenance.  Baseline CBC, CMP, lipid profile, vitamin D ordered.  TSH normal last year.  Follow-up in 1 year, sooner as needed.   Anastasio Auerbach MD, 8:41 AM 11/15/2017

## 2017-11-16 LAB — COMPREHENSIVE METABOLIC PANEL
AG Ratio: 1.8 (calc) (ref 1.0–2.5)
ALKALINE PHOSPHATASE (APISO): 67 U/L (ref 33–130)
ALT: 10 U/L (ref 6–29)
AST: 18 U/L (ref 10–35)
Albumin: 4.5 g/dL (ref 3.6–5.1)
BILIRUBIN TOTAL: 0.5 mg/dL (ref 0.2–1.2)
BUN: 14 mg/dL (ref 7–25)
CALCIUM: 9.5 mg/dL (ref 8.6–10.4)
CO2: 31 mmol/L (ref 20–32)
CREATININE: 0.85 mg/dL (ref 0.50–1.05)
Chloride: 106 mmol/L (ref 98–110)
GLOBULIN: 2.5 g/dL (ref 1.9–3.7)
Glucose, Bld: 91 mg/dL (ref 65–99)
POTASSIUM: 4.7 mmol/L (ref 3.5–5.3)
Sodium: 142 mmol/L (ref 135–146)
Total Protein: 7 g/dL (ref 6.1–8.1)

## 2017-11-16 LAB — CBC WITH DIFFERENTIAL/PLATELET
Basophils Absolute: 92 cells/uL (ref 0–200)
Basophils Relative: 2 %
EOS ABS: 212 {cells}/uL (ref 15–500)
Eosinophils Relative: 4.6 %
HEMATOCRIT: 39.8 % (ref 35.0–45.0)
Hemoglobin: 13.3 g/dL (ref 11.7–15.5)
LYMPHS ABS: 1569 {cells}/uL (ref 850–3900)
MCH: 28.6 pg (ref 27.0–33.0)
MCHC: 33.4 g/dL (ref 32.0–36.0)
MCV: 85.6 fL (ref 80.0–100.0)
MPV: 9.9 fL (ref 7.5–12.5)
Monocytes Relative: 9.3 %
NEUTROS PCT: 50 %
Neutro Abs: 2300 cells/uL (ref 1500–7800)
Platelets: 443 10*3/uL — ABNORMAL HIGH (ref 140–400)
RBC: 4.65 10*6/uL (ref 3.80–5.10)
RDW: 12.5 % (ref 11.0–15.0)
TOTAL LYMPHOCYTE: 34.1 %
WBC mixed population: 428 cells/uL (ref 200–950)
WBC: 4.6 10*3/uL (ref 3.8–10.8)

## 2017-11-16 LAB — LIPID PANEL
CHOL/HDL RATIO: 3 (calc) (ref <5.0)
CHOLESTEROL: 180 mg/dL (ref <200)
HDL: 61 mg/dL (ref >50)
LDL Cholesterol (Calc): 106 mg/dL (calc) — ABNORMAL HIGH
Non-HDL Cholesterol (Calc): 119 mg/dL (calc) (ref <130)
Triglycerides: 50 mg/dL (ref <150)

## 2017-11-16 LAB — VITAMIN D 25 HYDROXY (VIT D DEFICIENCY, FRACTURES): Vit D, 25-Hydroxy: 29 ng/mL — ABNORMAL LOW (ref 30–100)

## 2018-11-17 ENCOUNTER — Ambulatory Visit (INDEPENDENT_AMBULATORY_CARE_PROVIDER_SITE_OTHER): Payer: Self-pay | Admitting: Gynecology

## 2018-11-17 ENCOUNTER — Encounter: Payer: Self-pay | Admitting: Gynecology

## 2018-11-17 VITALS — BP 118/76 | Ht 63.0 in | Wt 147.0 lb

## 2018-11-17 DIAGNOSIS — E559 Vitamin D deficiency, unspecified: Secondary | ICD-10-CM

## 2018-11-17 DIAGNOSIS — Z13220 Encounter for screening for lipoid disorders: Secondary | ICD-10-CM

## 2018-11-17 DIAGNOSIS — Z01419 Encounter for gynecological examination (general) (routine) without abnormal findings: Secondary | ICD-10-CM

## 2018-11-17 NOTE — Patient Instructions (Addendum)
Follow-up in 1 year for annual exam, sooner if any issues.  Schedule your colonoscopy with either:  [facility]   Address: [address], [place], [place] [address]  [phone]    or  [facility]  Address: [address], [place],  [address]  [phone]       

## 2018-11-17 NOTE — Addendum Note (Signed)
Addended by: Nelva Nay on: 11/17/2018 09:34 AM   Modules accepted: Orders

## 2018-11-17 NOTE — Progress Notes (Signed)
    [name] Jul 19, 1961 [id number]        58 y.o.  G4P4 for annual gynecologic exam.  Doing well without gynecologic complaints  Past medical history,surgical history, problem list, medications, allergies, family history and social history were all reviewed and documented as reviewed in the EPIC chart.  ROS:  Performed with pertinent positives and negatives included in the history, assessment and plan.   Additional significant findings : None   Exam: Caryn Bee assistant Vitals:   11/17/18 0813  BP: 118/76  Weight: 147 lb (66.7 kg)  Height: 5\' 3"  (1.6 m)   Body mass index is 26.04 kg/m.  General appearance:  Normal affect, orientation and appearance. Skin: Grossly normal HEENT: Without gross lesions.  No cervical or supraclavicular adenopathy. Thyroid normal.  Lungs:  Clear without wheezing, rales or rhonchi Cardiac: RR, without RMG Abdominal:  Soft, nontender, without masses, guarding, rebound, organomegaly or hernia Breasts:  Examined lying and sitting without masses, retractions, discharge or axillary adenopathy. Pelvic:  Ext, BUS, Vagina: Normal with atrophic changes  Cervix: Normal with atrophic changes.  Pap smear done  Uterus: Anteverted, normal size, shape and contour, midline and mobile nontender   Adnexa: Without masses or tenderness    Anus and perineum: Normal   Rectovaginal: Normal sphincter tone without palpated masses or tenderness.    Assessment/Plan:  58 y.o. G82P4 female for annual gynecologic exam.   1. Postmenopausal.  Without significant menopausal symptoms or any vaginal bleeding. 2. Pap smear/HPV 2015.  Pap smear done today.  No history of significant abnormal Pap smears. 3. Mammography coming due and I reminded her to schedule this.  Breast exam normal today. 4. Colonoscopy never.  Need to arrange. 5. DEXA never.  Will plan further into the menopause.  Vitamin D deficiency last year.  On vitamin D now.  Check vitamin D level today. 6. Health  maintenance.  Baseline CBC, CMP, lipid profile and vitamin D ordered.  Follow-up 1 year, sooner as needed.   Anastasio Auerbach MD, 8:49 AM 11/17/2018

## 2018-11-18 LAB — COMPREHENSIVE METABOLIC PANEL
AG Ratio: 1.7 (calc) (ref 1.0–2.5)
ALT: 11 U/L (ref 6–29)
AST: 18 U/L (ref 10–35)
Albumin: 4.4 g/dL (ref 3.6–5.1)
Alkaline phosphatase (APISO): 60 U/L (ref 33–130)
BILIRUBIN TOTAL: 0.5 mg/dL (ref 0.2–1.2)
BUN: 11 mg/dL (ref 7–25)
CALCIUM: 9.2 mg/dL (ref 8.6–10.4)
CO2: 30 mmol/L (ref 20–32)
CREATININE: 0.77 mg/dL (ref 0.50–1.05)
Chloride: 106 mmol/L (ref 98–110)
Globulin: 2.6 g/dL (calc) (ref 1.9–3.7)
Glucose, Bld: 93 mg/dL (ref 65–99)
Potassium: 4.2 mmol/L (ref 3.5–5.3)
SODIUM: 142 mmol/L (ref 135–146)
TOTAL PROTEIN: 7 g/dL (ref 6.1–8.1)

## 2018-11-18 LAB — CBC WITH DIFFERENTIAL/PLATELET
Absolute Monocytes: 362 cells/uL (ref 200–950)
BASOS ABS: 59 {cells}/uL (ref 0–200)
Basophils Relative: 1.1 %
EOS PCT: 3.3 %
Eosinophils Absolute: 178 cells/uL (ref 15–500)
HEMATOCRIT: 41 % (ref 35.0–45.0)
Hemoglobin: 13.5 g/dL (ref 11.7–15.5)
LYMPHS ABS: 1458 {cells}/uL (ref 850–3900)
MCH: 29 pg (ref 27.0–33.0)
MCHC: 32.9 g/dL (ref 32.0–36.0)
MCV: 88 fL (ref 80.0–100.0)
MONOS PCT: 6.7 %
MPV: 10.3 fL (ref 7.5–12.5)
NEUTROS PCT: 61.9 %
Neutro Abs: 3343 cells/uL (ref 1500–7800)
PLATELETS: 436 10*3/uL — AB (ref 140–400)
RBC: 4.66 10*6/uL (ref 3.80–5.10)
RDW: 12.9 % (ref 11.0–15.0)
TOTAL LYMPHOCYTE: 27 %
WBC: 5.4 10*3/uL (ref 3.8–10.8)

## 2018-11-18 LAB — LIPID PANEL
CHOL/HDL RATIO: 3.5 (calc) (ref <5.0)
Cholesterol: 183 mg/dL (ref <200)
HDL: 53 mg/dL (ref >50)
LDL CHOLESTEROL (CALC): 115 mg/dL — AB
Non-HDL Cholesterol (Calc): 130 mg/dL (calc) — ABNORMAL HIGH (ref <130)
Triglycerides: 61 mg/dL (ref <150)

## 2018-11-18 LAB — VITAMIN D 25 HYDROXY (VIT D DEFICIENCY, FRACTURES): Vit D, 25-Hydroxy: 30 ng/mL (ref 30–100)

## 2018-11-19 ENCOUNTER — Encounter: Payer: Self-pay | Admitting: Gynecology

## 2018-11-19 LAB — PAP IG W/ RFLX HPV ASCU

## 2018-12-01 ENCOUNTER — Other Ambulatory Visit: Payer: Self-pay | Admitting: Gynecology

## 2018-12-01 DIAGNOSIS — Z1231 Encounter for screening mammogram for malignant neoplasm of breast: Secondary | ICD-10-CM

## 2018-12-21 HISTORY — PX: COLONOSCOPY W/ POLYPECTOMY: SHX1380

## 2019-01-01 ENCOUNTER — Other Ambulatory Visit: Payer: Self-pay

## 2019-01-01 ENCOUNTER — Ambulatory Visit
Admission: RE | Admit: 2019-01-01 | Discharge: 2019-01-01 | Disposition: A | Discharge status: Home / Self Care | Payer: Self-pay | Source: Ambulatory Visit | Attending: Gynecology | Admitting: Gynecology

## 2019-01-01 DIAGNOSIS — Z1231 Encounter for screening mammogram for malignant neoplasm of breast: Secondary | ICD-10-CM

## 2019-07-21 ENCOUNTER — Encounter: Payer: Self-pay | Admitting: Gynecology

## 2019-11-18 IMAGING — MG DIGITAL SCREENING BILATERAL MAMMOGRAM WITH TOMO AND CAD
8 series · 9 of 24 positions shown · non-contrast
Comparison: Previous exam(s).

CLINICAL DATA: Screening.

EXAM:
DIGITAL SCREENING BILATERAL MAMMOGRAM WITH TOMO AND CAD

[R MLO synth-2D]
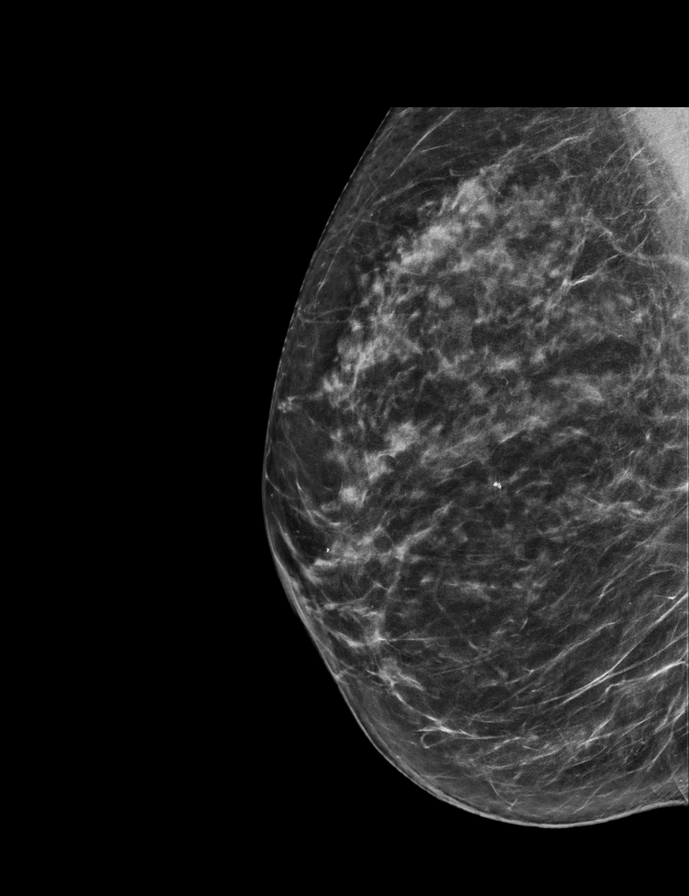

[L CC synth-2D]
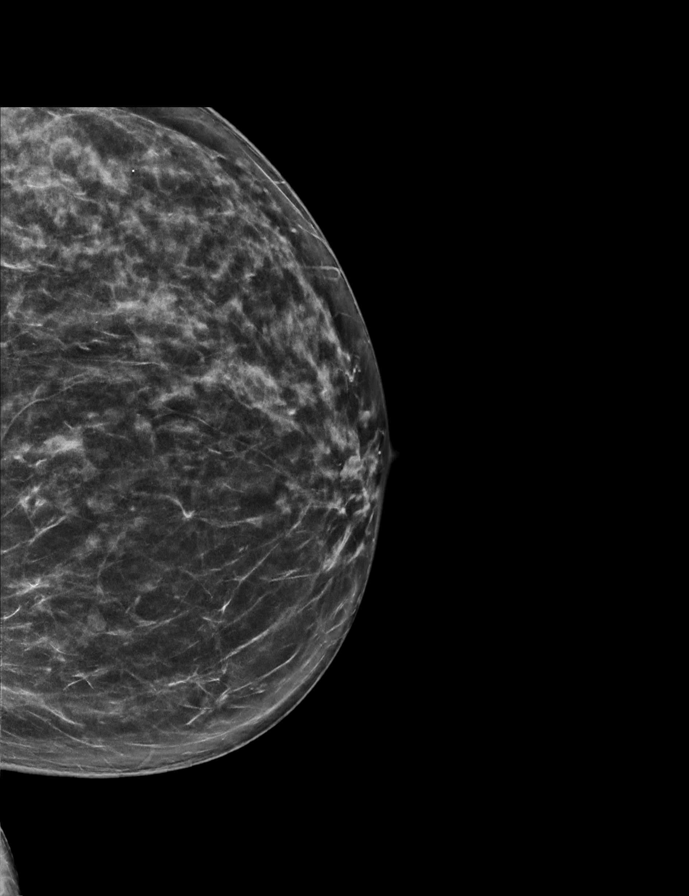

[R CC synth-2D]
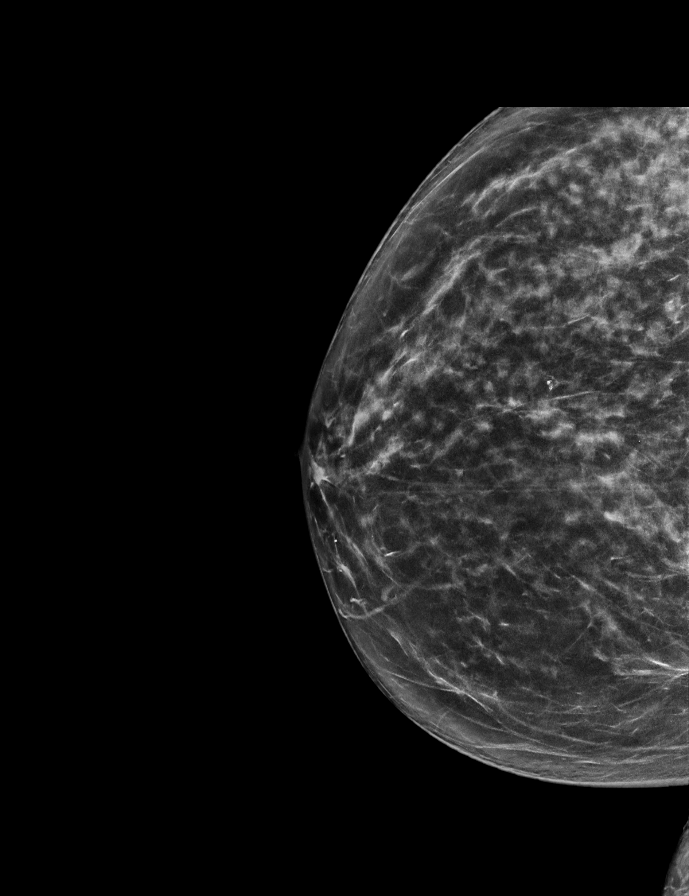

[L MLO synth-2D]
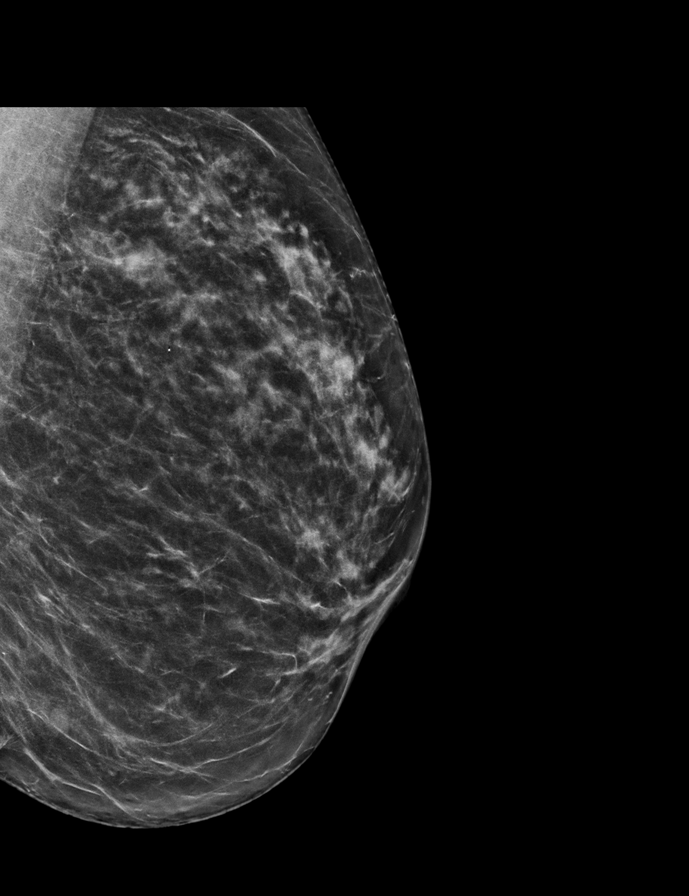

[L CC tomo · 2 of 66 frames shown]
[frame 22/66]
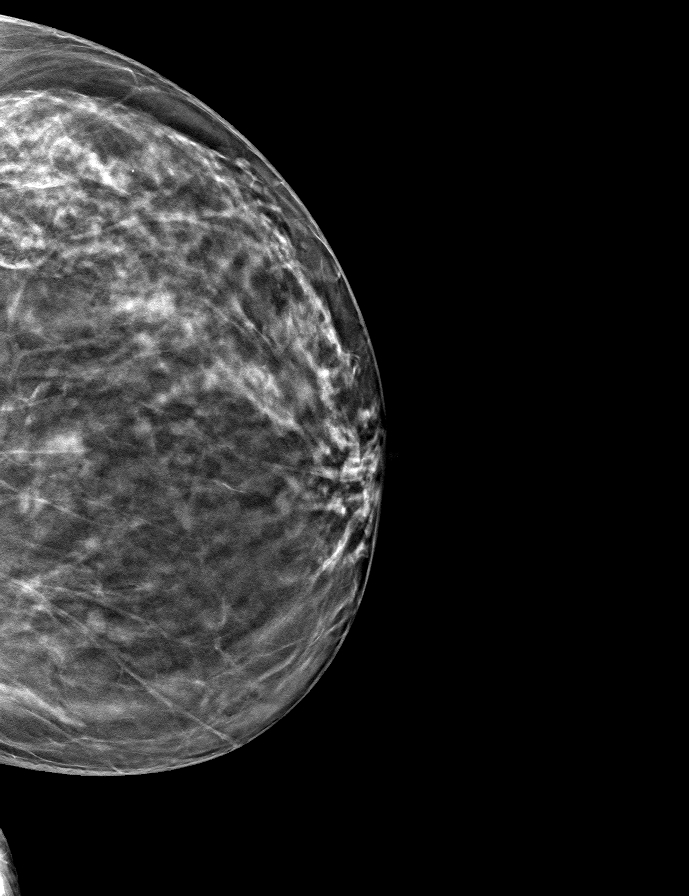
[frame 33/66]
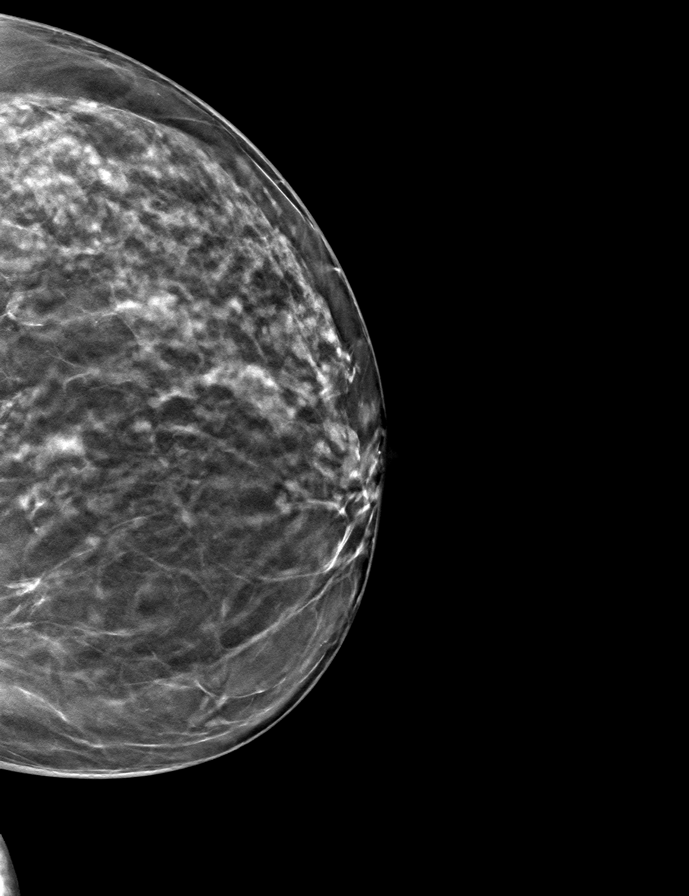

[R MLO tomo · tomo slice 35/68.0]
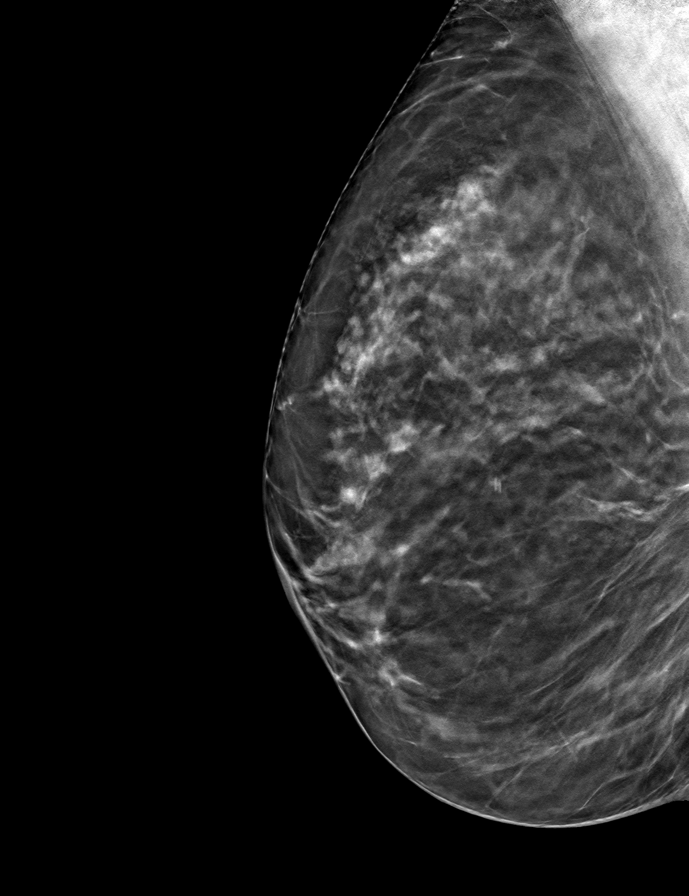

[L MLO tomo · tomo slice 34/67.0]
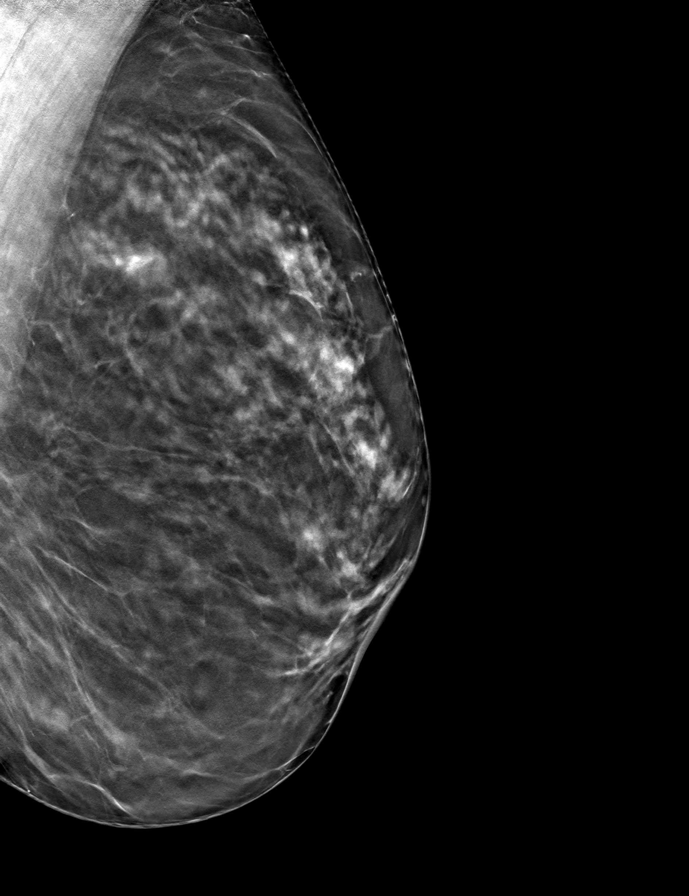

[R CC tomo · tomo slice 34/67.0]
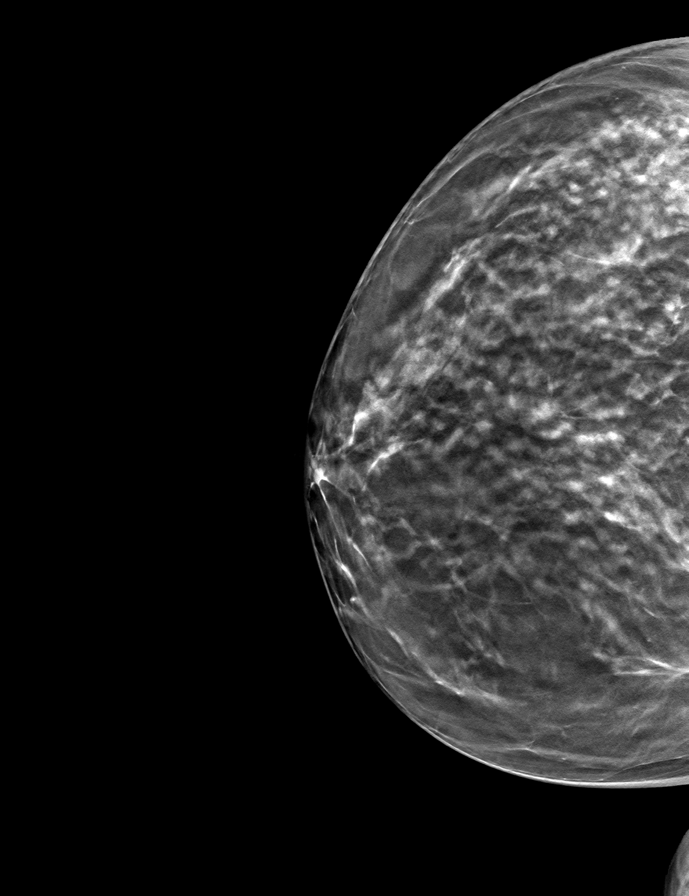

[9 of 24 positions shown; findings below may reference images not displayed]

ACR Breast Density Category c: The breast tissue is heterogeneously
dense, which may obscure small masses.
FINDINGS: There are no findings suspicious for malignancy. Images were
processed with CAD.
IMPRESSION: No mammographic evidence of malignancy. A result letter of this
screening mammogram will be mailed directly to the patient.

RECOMMENDATION:
Screening mammogram in one year. (Code:FT-U-LHB)

BI-RADS CATEGORY  1: Negative.

## 2019-12-01 ENCOUNTER — Encounter: Payer: Self-pay | Admitting: Obstetrics & Gynecology

## 2019-12-01 ENCOUNTER — Encounter: Payer: Self-pay | Admitting: Gynecology

## 2019-12-09 ENCOUNTER — Other Ambulatory Visit: Payer: Self-pay

## 2019-12-10 ENCOUNTER — Encounter: Payer: Self-pay | Admitting: Obstetrics & Gynecology

## 2020-02-10 ENCOUNTER — Other Ambulatory Visit: Payer: Self-pay

## 2020-02-11 ENCOUNTER — Ambulatory Visit (INDEPENDENT_AMBULATORY_CARE_PROVIDER_SITE_OTHER): Payer: Self-pay | Admitting: Obstetrics & Gynecology

## 2020-02-11 ENCOUNTER — Encounter: Payer: Self-pay | Admitting: Obstetrics & Gynecology

## 2020-02-11 VITALS — BP 122/80 | Ht 63.0 in | Wt 134.8 lb

## 2020-02-11 DIAGNOSIS — E559 Vitamin D deficiency, unspecified: Secondary | ICD-10-CM

## 2020-02-11 DIAGNOSIS — Z780 Asymptomatic menopausal state: Secondary | ICD-10-CM

## 2020-02-11 DIAGNOSIS — N952 Postmenopausal atrophic vaginitis: Secondary | ICD-10-CM

## 2020-02-11 DIAGNOSIS — Z13220 Encounter for screening for lipoid disorders: Secondary | ICD-10-CM

## 2020-02-11 DIAGNOSIS — Z01419 Encounter for gynecological examination (general) (routine) without abnormal findings: Secondary | ICD-10-CM

## 2020-02-11 LAB — CBC
HCT: 41.8 % (ref 35.0–45.0)
Hemoglobin: 13.7 g/dL (ref 11.7–15.5)
MCH: 28.8 pg (ref 27.0–33.0)
MCHC: 32.8 g/dL (ref 32.0–36.0)
MCV: 88 fL (ref 80.0–100.0)
MPV: 10.1 fL (ref 7.5–12.5)
Platelets: 458 10*3/uL — ABNORMAL HIGH (ref 140–400)
RBC: 4.75 10*6/uL (ref 3.80–5.10)
RDW: 12.7 % (ref 11.0–15.0)
WBC: 4.7 10*3/uL (ref 3.8–10.8)

## 2020-02-11 LAB — LIPID PANEL
Cholesterol: 205 mg/dL — ABNORMAL HIGH (ref <200)
HDL: 63 mg/dL (ref >=50)
LDL Cholesterol (Calc): 127 mg/dL (calc) — ABNORMAL HIGH
Non-HDL Cholesterol (Calc): 142 mg/dL (calc) — ABNORMAL HIGH (ref <130)
Total CHOL/HDL Ratio: 3.3 (calc) (ref <5.0)
Triglycerides: 56 mg/dL (ref <150)

## 2020-02-11 LAB — COMPREHENSIVE METABOLIC PANEL
AG Ratio: 1.6 (calc) (ref 1.0–2.5)
ALT: 11 U/L (ref 6–29)
AST: 21 U/L (ref 10–35)
Albumin: 4.4 g/dL (ref 3.6–5.1)
Alkaline phosphatase (APISO): 57 U/L (ref 37–153)
BUN: 17 mg/dL (ref 7–25)
CO2: 27 mmol/L (ref 20–32)
Calcium: 9.7 mg/dL (ref 8.6–10.4)
Chloride: 106 mmol/L (ref 98–110)
Creat: 0.86 mg/dL (ref 0.50–1.05)
Globulin: 2.7 g/dL (calc) (ref 1.9–3.7)
Glucose, Bld: 86 mg/dL (ref 65–99)
Potassium: 4.5 mmol/L (ref 3.5–5.3)
Sodium: 143 mmol/L (ref 135–146)
Total Bilirubin: 0.5 mg/dL (ref 0.2–1.2)
Total Protein: 7.1 g/dL (ref 6.1–8.1)

## 2020-02-11 LAB — TSH: TSH: 1.38 mIU/L (ref 0.40–4.50)

## 2020-02-11 LAB — VITAMIN D 25 HYDROXY (VIT D DEFICIENCY, FRACTURES): Vit D, 25-Hydroxy: 32 ng/mL (ref 30–100)

## 2020-02-11 MED ORDER — ESTRADIOL 10 MCG VA TABS: 1.0000 | ORAL_TABLET | VAGINAL | 4 refills | Status: DC

## 2020-02-11 NOTE — Progress Notes (Signed)
[name] 08-Jan-1961 [id number]   History:    59 y.o. G4P4L4 Married.  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  RP:  Established patient presenting for annual gyn exam   HPI: Postmenopause, well on no HRT.  No PMB.  No pelvic pain.  Worsening pain/dryness with IC.  Lubricants and moisturizers not sufficient.  Breasts normal.  Urine/BMs normal.  BMI 23.88.  Fasting health Labs here today. [place] 12/2018.  Past medical history,surgical history, family history and social history were all reviewed and documented in the EPIC chart.  Gynecologic History No LMP recorded. (Menstrual status: Perimenopausal).  Obstetric History OB History  Gravida Para Term Preterm AB Living  4 4       4   SAB TAB Ectopic Multiple Live Births               # Outcome Date GA Lbr Len/2nd Weight Sex Delivery Anes PTL Lv  4 Para           3 Para           2 Para           1 Para              ROS: A ROS was performed and pertinent positives and negatives are included in the history.  GENERAL: No fevers or chills. HEENT: No change in vision, no earache, sore throat or sinus congestion. NECK: No pain or stiffness. CARDIOVASCULAR: No chest pain or pressure. No palpitations. PULMONARY: No shortness of breath, cough or wheeze. GASTROINTESTINAL: No abdominal pain, nausea, vomiting or diarrhea, melena or bright red blood per rectum. GENITOURINARY: No urinary frequency, urgency, hesitancy or dysuria. MUSCULOSKELETAL: No joint or muscle pain, no back pain, no recent trauma. DERMATOLOGIC: No rash, no itching, no lesions. ENDOCRINE: No polyuria, polydipsia, no heat or cold intolerance. No recent change in weight. HEMATOLOGICAL: No anemia or easy bruising or bleeding. NEUROLOGIC: No headache, seizures, numbness, tingling or weakness. PSYCHIATRIC: No depression, no loss of interest in normal activity or change in sleep pattern.     Exam:   BP 122/80   Ht 5' 3"  (1.6 m)   Wt 134 lb 12.8 oz (61.1 kg)   BMI  23.88 kg/m   Body mass index is 23.88 kg/m.  General appearance : Well developed well nourished female. No acute distress HEENT: Eyes: no retinal hemorrhage or exudates,  Neck supple, trachea midline, no carotid bruits, no thyroidmegaly Lungs: Clear to auscultation, no rhonchi or wheezes, or rib retractions  Heart: Regular rate and rhythm, no murmurs or gallops Breast:Examined in sitting and supine position were symmetrical in appearance, no palpable masses or tenderness,  no skin retraction, no nipple inversion, no nipple discharge, no skin discoloration, no axillary or supraclavicular lymphadenopathy Abdomen: no palpable masses or tenderness, no rebound or guarding Extremities: no edema or skin discoloration or tenderness  Pelvic: Vulva: Normal             Vagina: No gross lesions or discharge  Cervix: No gross lesions or discharge  Uterus  AV, normal size, shape and consistency, non-tender and mobile  Adnexa  Without masses or tenderness  Anus: Normal   Assessment/Plan:  59 y.o. female for annual exam   1. Encounter for gynecological examination without abnormal finding Normal gynecologic exam in menopause.  Pap test January 2020 was negative, no indication to repeat this year.  Breast exam normal.  Screening mammogram March 2020 was negative, will schedule a mammogram now.  Colonoscopy March 2020.  Fasting health labs here today. - CBC - Comp Met (CMET) - TSH  2. Screening, lipid - Lipid panel  3. Vitamin D deficiency - VITAMIN D 25 Hydroxy (Vit-D Deficiency, Fractures)  4. Postmenopause Well on no hormone replacement therapy.  No postmenopausal bleeding.  Vitamin D supplements, calcium intake of 1200 mg daily and regular weightbearing physical activities recommended.  We will do a bone density at age 56.  24. Post-menopausal atrophic vaginitis Postmenopausal atrophic vaginitis with dryness and pain with intercourse.  Management discussed with patient.  Decision to start on  estradiol tablets vaginally twice a week.  No contraindication to the generic of Vagifem.  Prescription sent to pharmacy.  Other orders - Estradiol 10 MCG TABS vaginal tablet; Place 1 tablet (10 mcg total) vaginally 2 (two) times a week. Vaginal tab daily for 2 weeks, then twice a week.  Princess Bruins MD, 10:16 AM 02/11/2020

## 2020-02-12 ENCOUNTER — Encounter: Payer: Self-pay | Admitting: Obstetrics & Gynecology

## 2020-02-12 NOTE — Patient Instructions (Signed)
1. Encounter for gynecological examination without abnormal finding Normal gynecologic exam in menopause.  Pap test January 2020 was negative, no indication to repeat this year.  Breast exam normal.  Screening mammogram March 2020 was negative, will schedule a mammogram now.  Colonoscopy March 2020.  Fasting health labs here today. - CBC - Comp Met (CMET) - TSH  2. Screening, lipid - Lipid panel  3. Vitamin D deficiency - VITAMIN D 25 Hydroxy (Vit-D Deficiency, Fractures)  4. Postmenopause Well on no hormone replacement therapy.  No postmenopausal bleeding.  Vitamin D supplements, calcium intake of 1200 mg daily and regular weightbearing physical activities recommended.  We will do a bone density at age 12.  58. Post-menopausal atrophic vaginitis Postmenopausal atrophic vaginitis with dryness and pain with intercourse.  Management discussed with patient.  Decision to start on estradiol tablets vaginally twice a week.  No contraindication to the generic of Vagifem.  Prescription sent to pharmacy.  Other orders - Estradiol 10 MCG TABS vaginal tablet; Place 1 tablet (10 mcg total) vaginally 2 (two) times a week. Vaginal tab daily for 2 weeks, then twice a week.  Taziah, it was a pleasure meeting you today!  I will inform you of your results as soon as they are available.

## 2020-03-31 ENCOUNTER — Other Ambulatory Visit: Payer: Self-pay | Admitting: Obstetrics & Gynecology

## 2020-03-31 DIAGNOSIS — Z1231 Encounter for screening mammogram for malignant neoplasm of breast: Secondary | ICD-10-CM

## 2020-05-17 ENCOUNTER — Other Ambulatory Visit: Payer: Self-pay

## 2020-05-17 ENCOUNTER — Ambulatory Visit
Admission: RE | Admit: 2020-05-17 | Discharge: 2020-05-17 | Disposition: A | Discharge status: Home / Self Care | Payer: No Typology Code available for payment source | Source: Ambulatory Visit | Attending: Obstetrics & Gynecology | Admitting: Obstetrics & Gynecology

## 2020-05-17 DIAGNOSIS — Z1231 Encounter for screening mammogram for malignant neoplasm of breast: Secondary | ICD-10-CM

## 2021-02-16 ENCOUNTER — Ambulatory Visit (INDEPENDENT_AMBULATORY_CARE_PROVIDER_SITE_OTHER): Payer: Self-pay | Admitting: Obstetrics & Gynecology

## 2021-02-16 ENCOUNTER — Other Ambulatory Visit (HOSPITAL_COMMUNITY)
Admission: RE | Admit: 2021-02-16 | Discharge: 2021-02-16 | Disposition: A | Discharge status: Home / Self Care | Payer: No Typology Code available for payment source | Source: Ambulatory Visit | Attending: Obstetrics & Gynecology | Admitting: Obstetrics & Gynecology

## 2021-02-16 ENCOUNTER — Other Ambulatory Visit: Payer: Self-pay

## 2021-02-16 ENCOUNTER — Encounter: Payer: Self-pay | Admitting: Obstetrics & Gynecology

## 2021-02-16 VITALS — BP 120/78 | Ht 63.0 in | Wt 132.0 lb

## 2021-02-16 DIAGNOSIS — Z01419 Encounter for gynecological examination (general) (routine) without abnormal findings: Secondary | ICD-10-CM

## 2021-02-16 DIAGNOSIS — N952 Postmenopausal atrophic vaginitis: Secondary | ICD-10-CM

## 2021-02-16 DIAGNOSIS — Z780 Asymptomatic menopausal state: Secondary | ICD-10-CM

## 2021-02-16 MED ORDER — ESTRADIOL 10 MCG VA TABS: 1.0000 | ORAL_TABLET | VAGINAL | 4 refills | Status: DC

## 2021-02-16 NOTE — Progress Notes (Signed)
[name] 1960/11/04 [id number]   History:    60 y.o.  G4P4L4 Married.  Plays the flute with [place].  RP:  Established patient presenting for annual gyn exam   HPI: Postmenopause, well on no HRT.  No PMB.  No pelvic pain.  Much improved pain/dryness with IC on Estrogen cream and using coconut oil.  Breasts normal.  Urine/BMs normal.  BMI 23.38. Fasting health Labs here today. [place] 12/2018.   Past medical history,surgical history, family history and social history were all reviewed and documented in the EPIC chart.  Gynecologic History No LMP recorded. (Menstrual status: Perimenopausal).  Obstetric History OB History  Gravida Para Term Preterm AB Living  4 4       4   SAB IAB Ectopic Multiple Live Births               # Outcome Date GA Lbr Len/2nd Weight Sex Delivery Anes PTL Lv  4 Para           3 Para           2 Para           1 Para              ROS: A ROS was performed and pertinent positives and negatives are included in the history.  GENERAL: No fevers or chills. HEENT: No change in vision, no earache, sore throat or sinus congestion. NECK: No pain or stiffness. CARDIOVASCULAR: No chest pain or pressure. No palpitations. PULMONARY: No shortness of breath, cough or wheeze. GASTROINTESTINAL: No abdominal pain, nausea, vomiting or diarrhea, melena or bright red blood per rectum. GENITOURINARY: No urinary frequency, urgency, hesitancy or dysuria. MUSCULOSKELETAL: No joint or muscle pain, no back pain, no recent trauma. DERMATOLOGIC: No rash, no itching, no lesions. ENDOCRINE: No polyuria, polydipsia, no heat or cold intolerance. No recent change in weight. HEMATOLOGICAL: No anemia or easy bruising or bleeding. NEUROLOGIC: No headache, seizures, numbness, tingling or weakness. PSYCHIATRIC: No depression, no loss of interest in normal activity or change in sleep pattern.     Exam:   BP 120/78 (BP Location: Right Arm, Patient Position: Sitting, Cuff  Size: Normal)   Ht 5' 3"  (1.6 m)   Wt 132 lb (59.9 kg)   BMI 23.38 kg/m   Body mass index is 23.38 kg/m.  General appearance : Well developed well nourished female. No acute distress HEENT: Eyes: no retinal hemorrhage or exudates,  Neck supple, trachea midline, no carotid bruits, no thyroidmegaly Lungs: Clear to auscultation, no rhonchi or wheezes, or rib retractions  Heart: Regular rate and rhythm, no murmurs or gallops Breast:Examined in sitting and supine position were symmetrical in appearance, no palpable masses or tenderness,  no skin retraction, no nipple inversion, no nipple discharge, no skin discoloration, no axillary or supraclavicular lymphadenopathy Abdomen: no palpable masses or tenderness, no rebound or guarding Extremities: no edema or skin discoloration or tenderness  Pelvic: Vulva: Normal             Vagina: No gross lesions or discharge  Cervix: No gross lesions or discharge.  Pap reflex done.  Uterus  RV, normal size, shape and consistency, non-tender and mobile  Adnexa  Without masses or tenderness  Anus: Normal   Assessment/Plan:  60 y.o. female for annual exam   1. Encounter for routine gynecological examination with Papanicolaou smear of cervix Normal gynecologic exam.  Pap reflex done.  Breast exam normal.  Screening mammogram July 2021 was  negative.  Colonoscopy 2020.  Fasting health labs here today.  Good body mass index at 23.38.  Continue with fitness. - CBC - Comp Met (CMET) - Lipid Profile - TSH - Vitamin D 1,25 dihydroxy  2. Postmenopause Well on no systemic hormone replacement therapy.  No postmenopausal bleeding.  Taking vitamin D supplements, calcium intake of 1.5 g/day total recommended, continue with weightbearing physical activities.  3. Post-menopausal atrophic vaginitis Well on estradiol tablet once a week vaginally.  No contraindication to continue.  Prescription sent to pharmacy.  Other orders - Estradiol 10 MCG TABS vaginal tablet;  Place 1 tablet (10 mcg total) vaginally 2 (two) times a week. Vaginal tab daily for 2 weeks, then twice a week.  Princess Bruins MD, 8:19 AM 02/16/2021

## 2021-02-16 NOTE — Addendum Note (Signed)
Addended by: Princess Bruins on: 02/16/2021 11:02 AM   Modules accepted: Orders

## 2021-02-17 LAB — CYTOLOGY - PAP: Diagnosis: NEGATIVE

## 2021-02-19 LAB — CBC
HCT: 43.1 % (ref 35.0–45.0)
Hemoglobin: 14 g/dL (ref 11.7–15.5)
MCH: 28.9 pg (ref 27.0–33.0)
MCHC: 32.5 g/dL (ref 32.0–36.0)
MCV: 88.9 fL (ref 80.0–100.0)
MPV: 9.8 fL (ref 7.5–12.5)
Platelets: 462 10*3/uL — ABNORMAL HIGH (ref 140–400)
RBC: 4.85 10*6/uL (ref 3.80–5.10)
RDW: 12.4 % (ref 11.0–15.0)
WBC: 5.5 10*3/uL (ref 3.8–10.8)

## 2021-02-19 LAB — LIPID PANEL
Cholesterol: 204 mg/dL — ABNORMAL HIGH (ref <200)
HDL: 68 mg/dL (ref >=50)
LDL Cholesterol (Calc): 120 mg/dL (calc) — ABNORMAL HIGH
Non-HDL Cholesterol (Calc): 136 mg/dL (calc) — ABNORMAL HIGH (ref <130)
Total CHOL/HDL Ratio: 3 (calc) (ref <5.0)
Triglycerides: 66 mg/dL (ref <150)

## 2021-02-19 LAB — COMPREHENSIVE METABOLIC PANEL
AG Ratio: 2 (calc) (ref 1.0–2.5)
ALT: 11 U/L (ref 6–29)
AST: 20 U/L (ref 10–35)
Albumin: 4.9 g/dL (ref 3.6–5.1)
Alkaline phosphatase (APISO): 56 U/L (ref 37–153)
BUN: 13 mg/dL (ref 7–25)
CO2: 27 mmol/L (ref 20–32)
Calcium: 9.7 mg/dL (ref 8.6–10.4)
Chloride: 104 mmol/L (ref 98–110)
Creat: 0.95 mg/dL (ref 0.50–1.05)
Globulin: 2.4 g/dL (calc) (ref 1.9–3.7)
Glucose, Bld: 92 mg/dL (ref 65–99)
Potassium: 4.7 mmol/L (ref 3.5–5.3)
Sodium: 141 mmol/L (ref 135–146)
Total Bilirubin: 0.7 mg/dL (ref 0.2–1.2)
Total Protein: 7.3 g/dL (ref 6.1–8.1)

## 2021-02-19 LAB — VITAMIN D 1,25 DIHYDROXY
Vitamin D 1, 25 (OH)2 Total: 42 pg/mL (ref 18–72)
Vitamin D2 1, 25 (OH)2: <8 pg/mL
Vitamin D3 1, 25 (OH)2: 42 pg/mL

## 2021-02-19 LAB — TSH: TSH: 3.21 mIU/L (ref 0.40–4.50)

## 2021-05-23 ENCOUNTER — Other Ambulatory Visit: Payer: Self-pay | Admitting: Obstetrics & Gynecology

## 2021-05-23 DIAGNOSIS — Z1231 Encounter for screening mammogram for malignant neoplasm of breast: Secondary | ICD-10-CM

## 2021-05-25 ENCOUNTER — Ambulatory Visit
Admission: RE | Admit: 2021-05-25 | Discharge: 2021-05-25 | Disposition: A | Discharge status: Home / Self Care | Payer: No Typology Code available for payment source | Source: Ambulatory Visit | Attending: Obstetrics & Gynecology | Admitting: Obstetrics & Gynecology

## 2021-05-25 ENCOUNTER — Other Ambulatory Visit: Payer: Self-pay

## 2021-05-25 DIAGNOSIS — Z1231 Encounter for screening mammogram for malignant neoplasm of breast: Secondary | ICD-10-CM

## 2021-08-06 ENCOUNTER — Other Ambulatory Visit: Payer: Self-pay

## 2021-08-06 DIAGNOSIS — N390 Urinary tract infection, site not specified: Secondary | ICD-10-CM | POA: Insufficient documentation

## 2021-08-07 ENCOUNTER — Emergency Department (HOSPITAL_BASED_OUTPATIENT_CLINIC_OR_DEPARTMENT_OTHER)
Admission: EM | Admit: 2021-08-07 | Discharge: 2021-08-07 | Disposition: A | Discharge status: Home / Self Care | Payer: No Typology Code available for payment source | Attending: Emergency Medicine | Admitting: Emergency Medicine

## 2021-08-07 ENCOUNTER — Encounter (HOSPITAL_BASED_OUTPATIENT_CLINIC_OR_DEPARTMENT_OTHER): Payer: Self-pay | Admitting: *Deleted

## 2021-08-07 DIAGNOSIS — N390 Urinary tract infection, site not specified: Secondary | ICD-10-CM

## 2021-08-07 LAB — URINALYSIS, ROUTINE W REFLEX MICROSCOPIC
Bilirubin Urine: NEGATIVE
Glucose, UA: NEGATIVE mg/dL
Ketones, ur: NEGATIVE mg/dL
Nitrite: NEGATIVE
Protein, ur: 30 mg/dL — AB
Specific Gravity, Urine: 1.01 (ref 1.005–1.030)
pH: 5.5 (ref 5.0–8.0)

## 2021-08-07 LAB — URINALYSIS, MICROSCOPIC (REFLEX): RBC / HPF: >50 RBC/hpf (ref 0–5)

## 2021-08-07 MED ORDER — PHENAZOPYRIDINE HCL 100 MG PO TABS
200.0000 mg | ORAL_TABLET | Freq: Once | ORAL | Status: AC
  Administered 2021-08-07: 200 mg via ORAL
  Filled 2021-08-07: qty 2

## 2021-08-07 MED ORDER — PHENAZOPYRIDINE HCL 200 MG PO TABS: 200.0000 mg | ORAL_TABLET | Freq: Three times a day (TID) | ORAL | 0 refills | Status: DC | PRN

## 2021-08-07 MED ORDER — CIPROFLOXACIN HCL 250 MG PO TABS: 250.0000 mg | ORAL_TABLET | Freq: Two times a day (BID) | ORAL | 0 refills | Status: DC

## 2021-08-07 MED ORDER — CIPROFLOXACIN HCL 500 MG PO TABS
250.0000 mg | ORAL_TABLET | Freq: Once | ORAL | Status: AC
  Administered 2021-08-07: 250 mg via ORAL
  Filled 2021-08-07: qty 1

## 2021-08-07 NOTE — ED Provider Notes (Signed)
[facility] Provider Note: Georgena Spurling, MD, FACEP  CSN: [id number] MRN: [id number] ARRIVAL: 08/06/21 at 2357 ROOM: [place]  Dysuria   HISTORY OF PRESENT ILLNESS  08/07/21 2:54 AM [name] is a 60 y.o. female with a history of urinary tract infections in the past.  She is here with dysuria (urinary urgency and burning with urination) that began yesterday evening about 9 PM.  She rates her discomfort as a 2 out of 10, located in the suprapubic region.  She denies associated fever, chills or back pain.  She has subsequently noted hematuria as well.   History reviewed. No pertinent past medical history.  Past Surgical History:  Procedure Laterality Date   CESAREAN SECTION  1989   COLONOSCOPY W/ POLYPECTOMY  12/2018   DILATATION & CURETTAGE/HYSTEROSCOPY WITH TRUECLEAR N/A 07/28/2014   Procedure: DILATATION & CURETTAGE/HYSTEROSCOPY WITH TRUCLEAR;  Surgeon: Anastasio Auerbach, MD;  Location: [facility];  Service: Gynecology;  Laterality: N/A;    Family History  Problem Relation Age of Onset   Hypertension Father    Breast cancer Paternal Aunt 57   Rheum arthritis Mother    Cancer Mother        Glioblastoma    Social History   Tobacco Use   Smoking status: Never   Smokeless tobacco: Never  Vaping Use   Vaping Use: Never used  Substance Use Topics   Alcohol use: Yes    Alcohol/week: 0.0 standard drinks    Comment: Rare   Drug use: No    Prior to Admission medications   Medication Sig Start Date End Date Taking? Authorizing Provider  ciprofloxacin (CIPRO) 250 MG tablet Take 1 tablet (250 mg total) by mouth every 12 (twelve) hours. 08/07/21  Yes Soleil Mas, MD  phenazopyridine (PYRIDIUM) 200 MG tablet Take 1 tablet (200 mg total) by mouth 3 (three) times daily as needed for pain. 08/07/21  Yes Yoskar Murrillo, MD  Cholecalciferol (VITAMIN D PO) Take by mouth.    [provider]  Estradiol 10 MCG TABS vaginal tablet Place 1  tablet (10 mcg total) vaginally 2 (two) times a week. Vaginal tab daily for 2 weeks, then twice a week. 02/16/21   Princess Bruins, MD  Multiple Vitamin (MULTIVITAMIN) tablet Take 1 tablet by mouth daily.    [provider]    Allergies Nitrofurantoin monohyd macro, Penicillins, and Sulfa antibiotics   REVIEW OF SYSTEMS  Negative except as noted here or in the History of Present Illness.   PHYSICAL EXAMINATION  Initial Vital Signs Blood pressure 122/74, pulse 74, resp. rate 18, height 5\' 3"  (1.6 m), weight 61.2 kg, last menstrual period 07/23/2012, SpO2 100 %.  Examination General: Well-developed, well-nourished female in no acute distress; appearance consistent with age of record HENT: normocephalic; atraumatic Eyes: pupils equal, round and reactive to light; extraocular muscles intact Neck: supple Heart: regular rate and rhythm Lungs: clear to auscultation bilaterally Abdomen: soft; nondistended; suprapubic tenderness; bowel sounds present GU: No CVA tenderness Extremities: No deformity; full range of motion; pulses normal Neurologic: Awake, alert and oriented; motor function intact in all extremities and symmetric; no facial droop Skin: Warm and dry Psychiatric: Normal mood and affect   RESULTS  Summary of this visit's results, reviewed and interpreted by myself:   EKG Interpretation  Date/Time:    Ventricular Rate:    PR Interval:    QRS Duration:   QT Interval:    QTC Calculation:   R Axis:  Text Interpretation:         Laboratory Studies: Results for orders placed or performed during the hospital encounter of 08/07/21 (from the past 24 hour(s))  Urinalysis, Routine w reflex microscopic Urine, Clean Catch     Status: Abnormal   Collection Time: 08/07/21 12:43 AM  Result Value Ref Range   Color, Urine RED (A) YELLOW   APPearance CLOUDY (A) CLEAR   Specific Gravity, Urine 1.010 1.005 - 1.030   pH 5.5 5.0 - 8.0   Glucose, UA NEGATIVE NEGATIVE  mg/dL   Hgb urine dipstick LARGE (A) NEGATIVE   Bilirubin Urine NEGATIVE NEGATIVE   Ketones, ur NEGATIVE NEGATIVE mg/dL   Protein, ur 30 (A) NEGATIVE mg/dL   Nitrite NEGATIVE NEGATIVE   Leukocytes,Ua MODERATE (A) NEGATIVE  Urinalysis, Microscopic (reflex)     Status: Abnormal   Collection Time: 08/07/21 12:43 AM  Result Value Ref Range   RBC / HPF >50 0 - 5 RBC/hpf   WBC, UA 6-10 0 - 5 WBC/hpf   Bacteria, UA FEW (A) NONE SEEN   Squamous Epithelial / LPF 0-5 0 - 5   WBC Clumps PRESENT    Mucus PRESENT    Imaging Studies: No results found.  ED COURSE and MDM  Nursing notes, initial and subsequent vitals signs, including pulse oximetry, reviewed and interpreted by myself.  Vitals:   08/07/21 0014 08/07/21 0019 08/07/21 0300  BP:  122/74   Pulse:  74   Resp:  18   Temp:   98.8 F (37.1 C)  TempSrc:   Oral  SpO2:  100%   Weight: 61.2 kg    Height: 5\' 3"  (1.6 m)     Medications  ciprofloxacin (CIPRO) tablet 250 mg (has no administration in time range)  phenazopyridine (PYRIDIUM) tablet 200 mg (has no administration in time range)    Patient states she gets the best results from Cipro 250 mg twice daily for 3 days.  Presentation and urinalysis are consistent with hemorrhagic cystitis.  PROCEDURES  Procedures   ED DIAGNOSES     ICD-10-CM   1. Lower urinary tract infectious disease  N39.0          Kimanh Templeman, MD 08/07/21 [phone]

## 2021-08-07 NOTE — ED Triage Notes (Signed)
Pt reports urinary Sx this evening, concerned for UTI

## 2021-08-09 LAB — URINE CULTURE: Culture: >=100000 — AB

## 2021-08-10 ENCOUNTER — Telehealth: Payer: Self-pay

## 2021-08-10 NOTE — Telephone Encounter (Signed)
Post ED Visit - Positive Culture Follow-up  Culture report reviewed by antimicrobial stewardship pharmacist: [facility] Team [x]  Joetta Manners, Pharm.D, BCCCP []  Heide Guile, Pharm.D., BCPS AQ-ID []  Parks Neptune, Pharm.D., BCPS []  Alycia Rossetti, Pharm.D., BCPS []  [place], Pharm.D., BCPS, AAHIVP []  Legrand Como, Pharm.D., BCPS, AAHIVP []  Salome Arnt, PharmD, BCPS []  Johnnette Gourd, PharmD, BCPS []  Hughes Better, PharmD, BCPS []  Leeroy Cha, PharmD []  Laqueta Linden, PharmD, BCPS []  Albertina Parr, PharmD  [facility] Team []  Leodis Sias, PharmD []  Lindell Spar, PharmD []  Royetta Asal, PharmD []  Graylin Shiver, Rph []  Rema Fendt) Glennon Mac, PharmD []  Arlyn Dunning, PharmD []  Netta Cedars, PharmD []  Dia Sitter, PharmD []  Leone Haven, PharmD []  Gretta Arab, PharmD []  Theodis Shove, PharmD []  Peggyann Juba, PharmD []  Reuel Boom, PharmD   Positive urine culture Treated with Ciprofloxacin, organism sensitive to the same and no further patient follow-up is required at this time.  Glennon Hamilton 08/10/2021, 9:40 AM

## 2022-02-21 ENCOUNTER — Ambulatory Visit: Payer: Self-pay | Admitting: Obstetrics & Gynecology

## 2022-02-26 ENCOUNTER — Ambulatory Visit (INDEPENDENT_AMBULATORY_CARE_PROVIDER_SITE_OTHER): Payer: Self-pay | Admitting: Obstetrics & Gynecology

## 2022-02-26 ENCOUNTER — Encounter: Payer: Self-pay | Admitting: Obstetrics & Gynecology

## 2022-02-26 VITALS — BP 114/78 | HR 73 | Ht 63.0 in | Wt 136.0 lb

## 2022-02-26 DIAGNOSIS — Z780 Asymptomatic menopausal state: Secondary | ICD-10-CM

## 2022-02-26 DIAGNOSIS — N952 Postmenopausal atrophic vaginitis: Secondary | ICD-10-CM

## 2022-02-26 DIAGNOSIS — Z01419 Encounter for gynecological examination (general) (routine) without abnormal findings: Secondary | ICD-10-CM

## 2022-02-26 MED ORDER — ESTRADIOL 10 MCG VA TABS: 1.0000 | ORAL_TABLET | VAGINAL | 4 refills | Status: DC

## 2022-02-26 NOTE — Progress Notes (Signed)
? ? ?[name] 21-Sep-1961 [id number] ? ? ?History:    61 y.o. G10P4L4 Married.  Plays the flute with [place]. ?  ?RP:  Established patient presenting for annual gyn exam  ?  ?HPI: Postmenopause, well on no systemic HRT.  No PMB.  No pelvic pain.  Much improved pain/dryness with IC on Estradiol tab and using coconut oil.  Pap Neg 01/2021.  No h/o abnormal Pap.  Repeat Pap at 2 yrs.  Breasts normal.  Mammo Neg 05/2021. Urine/BMs normal.  BMI 24.09. Fasting health Labs here today. Col[place] 12/2018. ? ? ?Past medical history,surgical history, family history and social history were all reviewed and documented in the EPIC chart. ? ?Gynecologic History ?Patient's last menstrual period was 07/23/2012. ? ?Obstetric History ?OB History  ?Gravida Para Term Preterm AB Living  ?_0 ?SAB IAB Ectopic Multiple Live Births  ?           ?  ?# Outcome Date GA Lbr Len/2nd Weight Sex Delivery Anes PTL Lv  ?4 Para           ?3 Para           ?2 Para           ?1 Para           ? ? ? ?ROS: A ROS was performed and pertinent positives and negatives are included in the history. ? GENERAL: No fevers or chills. HEENT: No change in vision, no earache, sore throat or sinus congestion. NECK: No pain or stiffness. CARDIOVASCULAR: No chest pain or pressure. No palpitations. PULMONARY: No shortness of breath, cough or wheeze. GASTROINTESTINAL: No abdominal pain, nausea, vomiting or diarrhea, melena or bright red blood per rectum. GENITOURINARY: No urinary frequency, urgency, hesitancy or dysuria. MUSCULOSKELETAL: No joint or muscle pain, no back pain, no recent trauma. DERMATOLOGIC: No rash, no itching, no lesions. ENDOCRINE: No polyuria, polydipsia, no heat or cold intolerance. No recent change in weight. HEMATOLOGICAL: No anemia or easy bruising or bleeding. NEUROLOGIC: No headache, seizures, numbness, tingling or weakness. PSYCHIATRIC: No depression, no loss of interest in normal activity or change in sleep pattern.  ?   ? ?Exam: ? ? ?BP 114/78   Pulse 73   Ht _1  (1.6 m)   Wt 136 lb (61.7 kg)   LMP 07/23/2012   SpO2 99%   BMI 24.09 kg/m?  ? ?Body mass index is 24.09 kg/m?. ? ?General appearance : Well developed well nourished female. No acute distress ?HEENT: Eyes: no retinal hemorrhage or exudates,  Neck supple, trachea midline, no carotid bruits, no thyroidmegaly ?Lungs: Clear to auscultation, no rhonchi or wheezes, or rib retractions  ?Heart: Regular rate and rhythm, no murmurs or gallops ?Breast:Examined in sitting and supine position were symmetrical in appearance, no palpable masses or tenderness,  no skin retraction, no nipple inversion, no nipple discharge, no skin discoloration, no axillary or supraclavicular lymphadenopathy ?Abdomen: no palpable masses or tenderness, no rebound or guarding ?Extremities: no edema or skin discoloration or tenderness ? ?Pelvic: Vulva: Normal ?            Vagina: No gross lesions or discharge ? Cervix: No gross lesions or discharge ? Uterus  RV, normal size, shape and consistency, non-tender and mobile ? Adnexa  Without masses or tenderness ? Anus: Normal ? ? ?Assessment/Plan:  61 y.o. female for annual exam  ? ?1. Well female exam with routine gynecological exam ?Postmenopause, well  on no systemic HRT.  No PMB.  No pelvic pain.  Much improved pain/dryness with IC on Estradiol tab and using coconut oil.  Pap Neg 01/2021.  No h/o abnormal Pap.  Repeat Pap at 2 yrs.  Breasts normal.  Mammo Neg 05/2021. Urine/BMs normal.  BMI 24.09. Fasting health Labs here today. Colono 12/2018. ?- CBC ?- Comp Met (CMET) ?- Lipid Profile ?- TSH ?- Vitamin D (25 hydroxy) ? ?2. Postmenopause ?Well on no systemic HRT.  No PMB.  Low libido, counseling done.  Declines HRT or Testo for that purpose at this time. ? ?3. Post-menopausal atrophic vaginitis ?Well on Estradiol tab vaginally.  No CI to continue.  Prescription sent to pharmacy. ? ?Other orders ?- Estradiol 10 MCG TABS vaginal tablet; Place 1 tablet (10  mcg total) vaginally 2 (two) times a week. Vaginal tab daily for 2 weeks, then twice a week.  ? ?Princess Bruins MD, 8:38 AM 02/26/2022 ? ?  ?

## 2022-02-27 LAB — COMPREHENSIVE METABOLIC PANEL
AG Ratio: 1.7 (calc) (ref 1.0–2.5)
ALT: 12 U/L (ref 6–29)
AST: 19 U/L (ref 10–35)
Albumin: 4.5 g/dL (ref 3.6–5.1)
Alkaline phosphatase (APISO): 64 U/L (ref 37–153)
BUN: 14 mg/dL (ref 7–25)
CO2: 28 mmol/L (ref 20–32)
Calcium: 9.7 mg/dL (ref 8.6–10.4)
Chloride: 108 mmol/L (ref 98–110)
Creat: 0.88 mg/dL (ref 0.50–1.05)
Globulin: 2.7 g/dL (calc) (ref 1.9–3.7)
Glucose, Bld: 96 mg/dL (ref 65–99)
Potassium: 5.5 mmol/L — ABNORMAL HIGH (ref 3.5–5.3)
Sodium: 143 mmol/L (ref 135–146)
Total Bilirubin: 0.5 mg/dL (ref 0.2–1.2)
Total Protein: 7.2 g/dL (ref 6.1–8.1)

## 2022-02-27 LAB — CBC
HCT: 42 % (ref 35.0–45.0)
Hemoglobin: 13.6 g/dL (ref 11.7–15.5)
MCH: 28.6 pg (ref 27.0–33.0)
MCHC: 32.4 g/dL (ref 32.0–36.0)
MCV: 88.4 fL (ref 80.0–100.0)
MPV: 9.9 fL (ref 7.5–12.5)
Platelets: 466 10*3/uL — ABNORMAL HIGH (ref 140–400)
RBC: 4.75 10*6/uL (ref 3.80–5.10)
RDW: 13 % (ref 11.0–15.0)
WBC: 4.4 10*3/uL (ref 3.8–10.8)

## 2022-02-27 LAB — LIPID PANEL
Cholesterol: 195 mg/dL (ref <200)
HDL: 66 mg/dL (ref >=50)
LDL Cholesterol (Calc): 115 mg/dL (calc) — ABNORMAL HIGH
Non-HDL Cholesterol (Calc): 129 mg/dL (calc) (ref <130)
Total CHOL/HDL Ratio: 3 (calc) (ref <5.0)
Triglycerides: 53 mg/dL (ref <150)

## 2022-02-27 LAB — VITAMIN D 25 HYDROXY (VIT D DEFICIENCY, FRACTURES): Vit D, 25-Hydroxy: 44 ng/mL (ref 30–100)

## 2022-02-27 LAB — TSH: TSH: 3 mIU/L (ref 0.40–4.50)

## 2022-03-01 ENCOUNTER — Other Ambulatory Visit: Payer: Self-pay

## 2022-03-01 DIAGNOSIS — Z01419 Encounter for gynecological examination (general) (routine) without abnormal findings: Secondary | ICD-10-CM

## 2022-03-01 NOTE — Telephone Encounter (Signed)
Patient would like to have u/a checked as she said she is prone to UTI's and would just like to make sure it is clear. She said she asked at front desk to have it checked when she was here. I explained important to always mention to provider as well as they place the lab orders. ? ?Ok to order u/a? ?

## 2022-03-02 ENCOUNTER — Other Ambulatory Visit: Payer: Self-pay

## 2022-03-02 DIAGNOSIS — Z01419 Encounter for gynecological examination (general) (routine) without abnormal findings: Secondary | ICD-10-CM

## 2022-03-05 LAB — URINALYSIS, COMPLETE W/RFL CULTURE
Bilirubin Urine: NEGATIVE
Casts: NONE SEEN /LPF
Crystals: NONE SEEN /HPF
Glucose, UA: NEGATIVE
Hyaline Cast: NONE SEEN /LPF
Ketones, ur: NEGATIVE
Nitrites, Initial: NEGATIVE
Protein, ur: NEGATIVE
Specific Gravity, Urine: 1.01 (ref 1.001–1.035)
Yeast: NONE SEEN /HPF
pH: 5.5 (ref 5.0–8.0)

## 2022-03-05 LAB — CULTURE INDICATED

## 2022-03-05 LAB — URINE CULTURE
MICRO NUMBER:: 13388983
Result:: NO GROWTH
SPECIMEN QUALITY:: ADEQUATE

## 2022-03-12 NOTE — Telephone Encounter (Signed)
Left message to call Levana Minetti, RN at [facility], [phone].  

## 2022-03-12 NOTE — Telephone Encounter (Signed)
Reviewed with Dr. Dellis Filbert.  Spoke with patient.  OV scheduled for breast check only on 03/21/22 at 4:30pm. See account notes.  Patient agreeable to date and time.   Routing to Provider FYI.   Encounter closed.

## 2022-03-21 ENCOUNTER — Encounter: Payer: Self-pay | Admitting: Obstetrics & Gynecology

## 2022-03-21 ENCOUNTER — Ambulatory Visit (INDEPENDENT_AMBULATORY_CARE_PROVIDER_SITE_OTHER): Payer: Self-pay | Admitting: Obstetrics & Gynecology

## 2022-03-21 VITALS — BP 116/72

## 2022-03-21 DIAGNOSIS — Z01419 Encounter for gynecological examination (general) (routine) without abnormal findings: Secondary | ICD-10-CM

## 2022-03-21 NOTE — Progress Notes (Signed)
    [name] 06-Dec-1960 [id number]        60 y.o.  G4P4L4  RP: Complete Gynecologic Health Exam with Breast Exam  HPI: Patient thinks that her Breast exam was not done at her Gynecologic Health Exam with me on 02/26/2022.  No breast pain or lump.  No first degree relative with Breast Cancer.  Mammo 3D Negative on 05/25/2021.   OB History  Gravida Para Term Preterm AB Living  '4 4       4  '$ SAB IAB Ectopic Multiple Live Births               # Outcome Date GA Lbr Len/2nd Weight Sex Delivery Anes PTL Lv  4 Para           3 Para           2 Para           1 Para             Past medical history,surgical history, problem list, medications, allergies, family history and social history were all reviewed and documented in the EPIC chart.   Directed ROS with pertinent positives and negatives documented in the history of present illness/assessment and plan.  Exam:  Vitals:   03/21/22 1611  BP: 116/72   General appearance:  Normal  Breast exam:  R and L breasts normal exam.  No nodule or mass, normal skin, normal nipples.  No axillary LN bilaterally.   Assessment/Plan:  61 y.o. G4P4   1. Well female exam with routine gynecological exam Patient thinks that her Breast exam was not done at her [place] with me on 02/26/2022.  No breast pain or lump.  No first degree relative with Breast Cancer.  Mammo 3D Negative on 05/25/2021. Breast exam:  R and L breasts normal exam.  No nodule or mass, normal skin, normal nipples.  No axillary LN bilaterally.  Patient reassured.   Other orders - UNABLE TO FIND; Allergy pill   Princess Bruins MD, 4:27 PM 03/21/2022

## 2022-03-27 ENCOUNTER — Encounter: Payer: Self-pay | Admitting: Obstetrics & Gynecology

## 2022-05-24 ENCOUNTER — Other Ambulatory Visit: Payer: Self-pay | Admitting: Obstetrics & Gynecology

## 2022-05-24 DIAGNOSIS — Z1231 Encounter for screening mammogram for malignant neoplasm of breast: Secondary | ICD-10-CM

## 2022-06-04 ENCOUNTER — Ambulatory Visit
Admission: RE | Admit: 2022-06-04 | Discharge: 2022-06-04 | Disposition: A | Discharge status: Home / Self Care | Payer: No Typology Code available for payment source | Source: Ambulatory Visit | Attending: Obstetrics & Gynecology | Admitting: Obstetrics & Gynecology

## 2022-06-04 DIAGNOSIS — Z1231 Encounter for screening mammogram for malignant neoplasm of breast: Secondary | ICD-10-CM

## 2023-03-01 ENCOUNTER — Ambulatory Visit: Payer: Self-pay | Admitting: Obstetrics & Gynecology

## 2023-03-14 ENCOUNTER — Encounter: Payer: Self-pay | Admitting: Obstetrics & Gynecology

## 2023-03-14 ENCOUNTER — Ambulatory Visit (INDEPENDENT_AMBULATORY_CARE_PROVIDER_SITE_OTHER): Payer: Self-pay | Admitting: Obstetrics & Gynecology

## 2023-03-14 ENCOUNTER — Other Ambulatory Visit (HOSPITAL_COMMUNITY)
Admission: RE | Admit: 2023-03-14 | Discharge: 2023-03-14 | Disposition: A | Discharge status: Home / Self Care | Payer: Self-pay | Source: Ambulatory Visit | Attending: Obstetrics & Gynecology | Admitting: Obstetrics & Gynecology

## 2023-03-14 VITALS — BP 110/74 | HR 68 | Ht 62.75 in | Wt 138.0 lb

## 2023-03-14 DIAGNOSIS — N952 Postmenopausal atrophic vaginitis: Secondary | ICD-10-CM

## 2023-03-14 DIAGNOSIS — Z01419 Encounter for gynecological examination (general) (routine) without abnormal findings: Secondary | ICD-10-CM

## 2023-03-14 DIAGNOSIS — Z780 Asymptomatic menopausal state: Secondary | ICD-10-CM

## 2023-03-14 MED ORDER — ESTRADIOL 10 MCG VA TABS: 1.0000 | ORAL_TABLET | VAGINAL | 4 refills | Status: DC

## 2023-03-14 NOTE — Progress Notes (Signed)
[name] 10-Jul-1961 [id number]   History:    62 y.o.  G4P4L4   RP: Established patient presenting for Gyn health exam   HPI: Postmenopause, well on no systemic HRT.  No PMB.  Much improved pain/dryness with IC on Estradiol tab and using coconut oil. Pap test Neg 01/2021.  No h/o abnormal Pap. Pap reflex today. Breasts normal.  No first degree relative with Breast Cancer.  Mammo 3D Negative on 05/25/2022.  Colono 10/2022.  BMI 24.645.  Fasting health labs here today.   Past medical history,surgical history, family history and social history were all reviewed and documented in the EPIC chart.  Gynecologic History Patient's last menstrual period was 07/23/2012.  Obstetric History OB History  Gravida Para Term Preterm AB Living  4 4 4     4   SAB IAB Ectopic Multiple Live Births               # Outcome Date GA Lbr Len/2nd Weight Sex Delivery Anes PTL Lv  4 Term           3 Term           2 Term           1 Term              ROS: A ROS was performed and pertinent positives and negatives are included in the history. GENERAL: No fevers or chills. HEENT: No change in vision, no earache, sore throat or sinus congestion. NECK: No pain or stiffness. CARDIOVASCULAR: No chest pain or pressure. No palpitations. PULMONARY: No shortness of breath, cough or wheeze. GASTROINTESTINAL: No abdominal pain, nausea, vomiting or diarrhea, melena or bright red blood per rectum. GENITOURINARY: No urinary frequency, urgency, hesitancy or dysuria. MUSCULOSKELETAL: No joint or muscle pain, no back pain, no recent trauma. DERMATOLOGIC: No rash, no itching, no lesions. ENDOCRINE: No polyuria, polydipsia, no heat or cold intolerance. No recent change in weight. HEMATOLOGICAL: No anemia or easy bruising or bleeding. NEUROLOGIC: No headache, seizures, numbness, tingling or weakness. PSYCHIATRIC: No depression, no loss of interest in normal activity or change in sleep pattern.     Exam:   BP 110/74   Pulse 68    Ht 5' 2.75" (1.594 m)   Wt 138 lb (62.6 kg)   LMP 07/23/2012 Comment: sexually active  SpO2 99%   BMI 24.64 kg/m   Body mass index is 24.64 kg/m.  General appearance : Well developed well nourished female. No acute distress HEENT: Eyes: no retinal hemorrhage or exudates,  Neck supple, trachea midline, no carotid bruits, no thyroidmegaly Lungs: Clear to auscultation, no rhonchi or wheezes, or rib retractions  Heart: Regular rate and rhythm, no murmurs or gallops Breast:Examined in sitting and supine position were symmetrical in appearance, no palpable masses or tenderness,  no skin retraction, no nipple inversion, no nipple discharge, no skin discoloration, no axillary or supraclavicular lymphadenopathy Abdomen: no palpable masses or tenderness, no rebound or guarding Extremities: no edema or skin discoloration or tenderness  Pelvic: Vulva: Normal             Vagina: No gross lesions or discharge  Cervix: No gross lesions or discharge.  Pap reflex done.  Uterus  AV, normal size, shape and consistency, non-tender and mobile  Adnexa  Without masses or tenderness  Anus: Normal   Assessment/Plan:  62 y.o. female for annual exam   1. Encounter for routine gynecological examination with Papanicolaou smear of cervix Postmenopause, well on no  systemic HRT.  No PMB.  Much improved pain/dryness with IC on Estradiol tab and using coconut oil. Pap test Neg 01/2021.  No h/o abnormal Pap. Pap reflex today. Breasts normal.  No first degree relative with Breast Cancer.  Mammo 3D Negative on 05/25/2022.  Colono 10/2022.  BMI 24.645.  Fasting health labs here today. - Cytology - PAP( [place]) - CBC - Comp Met (CMET) - TSH - Lipid Profile - Vitamin D (25 hydroxy)  2. Postmenopause Postmenopause, well on no systemic HRT.  No PMB.    3. Post-menopausal atrophic vaginitis Much improved pain/dryness with IC on Estradiol tab and using coconut oil.  No CI, Estradiol tab prescription sent.  Other  orders - Ascorbic Acid (VITAMIN C PO); Take by mouth. - CALCIUM PO; Take by mouth. - Estradiol 10 MCG TABS vaginal tablet; Place 1 tablet (10 mcg total) vaginally 2 (two) times a week. Vaginal tab daily for 2 weeks, then twice a week.   Genia Del MD, 8:16 AM

## 2023-03-15 LAB — CBC
HCT: 41.3 % (ref 35.0–45.0)
Hemoglobin: 13.4 g/dL (ref 11.7–15.5)
MCH: 28.6 pg (ref 27.0–33.0)
MCHC: 32.4 g/dL (ref 32.0–36.0)
MCV: 88.1 fL (ref 80.0–100.0)
MPV: 10.1 fL (ref 7.5–12.5)
Platelets: 427 10*3/uL — ABNORMAL HIGH (ref 140–400)
RBC: 4.69 10*6/uL (ref 3.80–5.10)
RDW: 12.9 % (ref 11.0–15.0)
WBC: 4.7 10*3/uL (ref 3.8–10.8)

## 2023-03-15 LAB — COMPREHENSIVE METABOLIC PANEL
AG Ratio: 2 (calc) (ref 1.0–2.5)
ALT: 18 U/L (ref 6–29)
AST: 27 U/L (ref 10–35)
Albumin: 4.5 g/dL (ref 3.6–5.1)
Alkaline phosphatase (APISO): 54 U/L (ref 37–153)
BUN: 16 mg/dL (ref 7–25)
CO2: 27 mmol/L (ref 20–32)
Calcium: 9.2 mg/dL (ref 8.6–10.4)
Chloride: 107 mmol/L (ref 98–110)
Creat: 0.88 mg/dL (ref 0.50–1.05)
Globulin: 2.3 g/dL (calc) (ref 1.9–3.7)
Glucose, Bld: 79 mg/dL (ref 65–99)
Potassium: 4.9 mmol/L (ref 3.5–5.3)
Sodium: 142 mmol/L (ref 135–146)
Total Bilirubin: 0.6 mg/dL (ref 0.2–1.2)
Total Protein: 6.8 g/dL (ref 6.1–8.1)

## 2023-03-15 LAB — LIPID PANEL
Cholesterol: 181 mg/dL (ref <200)
HDL: 64 mg/dL (ref >=50)
LDL Cholesterol (Calc): 102 mg/dL (calc) — ABNORMAL HIGH
Non-HDL Cholesterol (Calc): 117 mg/dL (calc) (ref <130)
Total CHOL/HDL Ratio: 2.8 (calc) (ref <5.0)
Triglycerides: 61 mg/dL (ref <150)

## 2023-03-15 LAB — VITAMIN D 25 HYDROXY (VIT D DEFICIENCY, FRACTURES): Vit D, 25-Hydroxy: 34 ng/mL (ref 30–100)

## 2023-03-15 LAB — TSH: TSH: 1.9 mIU/L (ref 0.40–4.50)

## 2023-03-20 LAB — CYTOLOGY - PAP: Diagnosis: NEGATIVE

## 2023-05-21 ENCOUNTER — Other Ambulatory Visit: Payer: Self-pay | Admitting: Obstetrics & Gynecology

## 2023-05-21 DIAGNOSIS — Z1231 Encounter for screening mammogram for malignant neoplasm of breast: Secondary | ICD-10-CM

## 2023-06-06 ENCOUNTER — Ambulatory Visit
Admission: RE | Admit: 2023-06-06 | Discharge: 2023-06-06 | Disposition: A | Discharge status: Home / Self Care | Payer: No Typology Code available for payment source | Source: Ambulatory Visit | Attending: Obstetrics & Gynecology | Admitting: Obstetrics & Gynecology

## 2023-06-06 DIAGNOSIS — Z1231 Encounter for screening mammogram for malignant neoplasm of breast: Secondary | ICD-10-CM

## 2024-04-14 ENCOUNTER — Ambulatory Visit: Payer: Self-pay | Admitting: Obstetrics and Gynecology

## 2024-04-20 NOTE — Progress Notes (Unsigned)
 63 y.o. G19P4004 Married Caucasian female here for annual exam. Having some discomfort during intercourse.    Decreased libido in menopause.  Vaginal estradiol  is helping to reduce bladder infections but still having pain with intercourse.  Using lubricants with relations and uses Replens.   States she has a low resting heart rate in the 50s, according to her Apple watch.   Wants to do her blood work here.  She is fasting today.   From [place] .  Her husband is a Education officer, environmental.  She works at [employer] as a Scientist, physiological.  Building services engineer.   PCP: Patrcia Lynwood RAMAN, MD   Patient's last menstrual period was 07/23/2012.           Sexually active: Yes.    The current method of family planning is vasectomy.    Menopausal hormone therapy:  Estradiol  vaginal tablets Exercising: Yes.    Walking Smoker:  no  OB History  Gravida Para Term Preterm AB Living  4 4 4   4   SAB IAB Ectopic Multiple Live Births          # Outcome Date GA Lbr Len/2nd Weight Sex Type Anes PTL Lv  4 Term           3 Term           2 Term           1 Term              HEALTH MAINTENANCE: Last 2 paps:  03/14/23 neg, 02/16/21 neg  History of abnormal Pap or positive HPV:  no Mammogram:   06/06/23 Breast Density Cat B, BIRADS Cat 1 neg Colonoscopy:  10/29/23 Care Everywhere -  polyps - due in 2026. Bone Density:  n/a  Result  n/a   Immunization History  Administered Date(s) Administered   PFIZER Comirnaty(Gray Top)Covid-19 Tri-Sucrose Vaccine 03/08/2020, 03/29/2020      reports that she has never smoked. She has never used smokeless tobacco. She reports that she does not currently use alcohol. She reports that she does not use drugs.  Past Medical History:  Diagnosis Date   Rosacea     Past Surgical History:  Procedure Laterality Date   CESAREAN SECTION  1989   COLONOSCOPY W/ POLYPECTOMY  12/2018   DILATATION & CURETTAGE/HYSTEROSCOPY WITH TRUECLEAR N/A 07/28/2014   Procedure: DILATATION &  CURETTAGE/HYSTEROSCOPY WITH TRUCLEAR;  Surgeon: Evalene SHAUNNA Organ, MD;  Location: [facility];  Service: Gynecology;  Laterality: N/A;    Current Outpatient Medications  Medication Sig Dispense Refill   Ascorbic Acid (VITAMIN C PO) Take by mouth.     CALCIUM PO Take by mouth.     Cholecalciferol (VITAMIN D  PO) Take by mouth.     Estradiol  10 MCG TABS vaginal tablet Place 1 tablet (10 mcg total) vaginally 2 (two) times a week. Vaginal tab daily for 2 weeks, then twice a week. 24 tablet 4   Multiple Vitamin (MULTIVITAMIN) tablet Take 1 tablet by mouth daily.     No current facility-administered medications for this visit.    ALLERGIES: Nitrofurantoin monohyd macro, Penicillins, and Sulfa antibiotics  Family History  Problem Relation Age of Onset   Hypertension Father    Breast cancer Paternal Aunt 36   Rheum arthritis Mother    Cancer Mother        Glioblastoma    Review of Systems  All other systems reviewed and are negative.   PHYSICAL EXAM:  BP 118/82 (BP Location: Left  Arm, Patient Position: Sitting)   Pulse 87   Ht 5' 3.5 (1.613 m)   Wt 139 lb (63 kg)   LMP 07/23/2012 Comment: sexually active  SpO2 97%   BMI 24.24 kg/m     General appearance: alert, cooperative and appears stated age Head: normocephalic, without obvious abnormality, atraumatic Neck: no adenopathy, supple, symmetrical, trachea midline and thyroid normal to inspection and palpation Lungs: clear to auscultation bilaterally Breasts: normal appearance, no masses or tenderness, No nipple retraction or dimpling, No nipple discharge or bleeding, No axillary adenopathy Heart: regular rate and rhythm Abdomen: soft, non-tender; no masses, no organomegaly Extremities: extremities normal, atraumatic, no cyanosis or edema Skin: skin color, texture, turgor normal. No rashes or lesions Lymph nodes: cervical, supraclavicular, and axillary nodes normal. Neurologic: grossly normal  Pelvic: External genitalia:  no  lesions              No abnormal inguinal nodes palpated.              Urethra:  normal appearing urethra with no masses, tenderness or lesions              Bartholins and Skenes: normal                 Vagina: normal appearing vagina with normal color and discharge, no lesions              Cervix: no lesions              Pap taken: no Bimanual Exam:  Uterus:  normal size, contour, position, consistency, mobility, non-tender              Adnexa: no mass, fullness, tenderness              Rectal exam: yes.  Confirms.              Anus:  normal sphincter tone, no lesions  Chaperone was present for exam:  Kari HERO, CMA  ASSESSMENT: Well woman visit with gynecologic exam. Vaginal atrophy.  Decreased libido.  PHQ-2-9: 0 Low resting heart rate.  Self reported.   PLAN: Mammogram screening discussed. Self breast awareness reviewed. Pap and HRV collected:  no.  Due in 2027.  Guidelines for Calcium, Vitamin D , regular exercise program including cardiovascular and weight bearing exercise. Medication refills:  Vaginal estradiol  10 mcg twice weekly.  Will add vit E suppositories 200 international units twice weekly.  We discussed Wellbutrin and testosterone tx.  No Rx given.  Will do routine labs.  Follow up with PCP regarding low resting heart rate.  Follow up:  yearly and prn.

## 2024-04-21 ENCOUNTER — Encounter: Payer: Self-pay | Admitting: Obstetrics and Gynecology

## 2024-04-21 ENCOUNTER — Ambulatory Visit (INDEPENDENT_AMBULATORY_CARE_PROVIDER_SITE_OTHER): Payer: Self-pay | Admitting: Obstetrics and Gynecology

## 2024-04-21 VITALS — BP 118/82 | HR 87 | Ht 63.5 in | Wt 139.0 lb

## 2024-04-21 DIAGNOSIS — Z01419 Encounter for gynecological examination (general) (routine) without abnormal findings: Secondary | ICD-10-CM

## 2024-04-21 DIAGNOSIS — Z1331 Encounter for screening for depression: Secondary | ICD-10-CM

## 2024-04-21 DIAGNOSIS — Z0000 Encounter for general adult medical examination without abnormal findings: Secondary | ICD-10-CM

## 2024-04-21 DIAGNOSIS — N952 Postmenopausal atrophic vaginitis: Secondary | ICD-10-CM

## 2024-04-21 MED ORDER — ESTRADIOL 10 MCG VA TABS: 1.0000 | ORAL_TABLET | VAGINAL | 4 refills | Status: AC

## 2024-04-21 MED ORDER — NONFORMULARY OR COMPOUNDED ITEM: 11 refills | Status: AC

## 2024-04-21 NOTE — Patient Instructions (Signed)

## 2024-04-22 LAB — CBC
HCT: 40.6 % (ref 35.0–45.0)
Hemoglobin: 13 g/dL (ref 11.7–15.5)
MCH: 28.9 pg (ref 27.0–33.0)
MCHC: 32 g/dL (ref 32.0–36.0)
MCV: 90.2 fL (ref 80.0–100.0)
MPV: 10.3 fL (ref 7.5–12.5)
Platelets: 422 10*3/uL — ABNORMAL HIGH (ref 140–400)
RBC: 4.5 10*6/uL (ref 3.80–5.10)
RDW: 13.3 % (ref 11.0–15.0)
WBC: 5.1 10*3/uL (ref 3.8–10.8)

## 2024-04-22 LAB — COMPREHENSIVE METABOLIC PANEL WITH GFR
AG Ratio: 1.7 (calc) (ref 1.0–2.5)
ALT: 13 U/L (ref 6–29)
AST: 20 U/L (ref 10–35)
Albumin: 4.3 g/dL (ref 3.6–5.1)
Alkaline phosphatase (APISO): 65 U/L (ref 37–153)
BUN: 16 mg/dL (ref 7–25)
CO2: 29 mmol/L (ref 20–32)
Calcium: 9 mg/dL (ref 8.6–10.4)
Chloride: 107 mmol/L (ref 98–110)
Creat: 0.83 mg/dL (ref 0.50–1.05)
Globulin: 2.5 g/dL (ref 1.9–3.7)
Glucose, Bld: 87 mg/dL (ref 65–99)
Potassium: 4.8 mmol/L (ref 3.5–5.3)
Sodium: 141 mmol/L (ref 135–146)
Total Bilirubin: 0.6 mg/dL (ref 0.2–1.2)
Total Protein: 6.8 g/dL (ref 6.1–8.1)
eGFR: 79 mL/min/{1.73_m2} (ref >=60)

## 2024-04-22 LAB — LIPID PANEL
Cholesterol: 177 mg/dL (ref <200)
HDL: 59 mg/dL (ref >=50)
LDL Cholesterol (Calc): 103 mg/dL — ABNORMAL HIGH
Non-HDL Cholesterol (Calc): 118 mg/dL (ref <130)
Total CHOL/HDL Ratio: 3 (calc) (ref <5.0)
Triglycerides: 63 mg/dL (ref <150)

## 2024-04-22 LAB — VITAMIN D 25 HYDROXY (VIT D DEFICIENCY, FRACTURES): Vit D, 25-Hydroxy: 39 ng/mL (ref 30–100)

## 2024-04-22 LAB — TSH: TSH: 1.31 m[IU]/L (ref 0.40–4.50)

## 2024-04-23 ENCOUNTER — Ambulatory Visit: Payer: Self-pay | Admitting: Obstetrics and Gynecology

## 2024-11-12 ENCOUNTER — Other Ambulatory Visit: Payer: Self-pay | Admitting: Obstetrics and Gynecology

## 2024-11-12 DIAGNOSIS — Z1231 Encounter for screening mammogram for malignant neoplasm of breast: Secondary | ICD-10-CM

## 2024-11-20 ENCOUNTER — Ambulatory Visit
Admission: RE | Admit: 2024-11-20 | Discharge: 2024-11-20 | Disposition: A | Source: Ambulatory Visit | Attending: Obstetrics and Gynecology | Admitting: Obstetrics and Gynecology

## 2024-11-20 DIAGNOSIS — Z1231 Encounter for screening mammogram for malignant neoplasm of breast: Secondary | ICD-10-CM

## 2024-11-25 ENCOUNTER — Ambulatory Visit: Payer: Self-pay | Admitting: Obstetrics and Gynecology
